# Patient Record
Sex: Female | Born: 1970
Health system: Southern US, Community
[De-identification: ages and names within clinical notes are randomized; demographics above are authoritative.]

## PROBLEM LIST (undated history)

## (undated) DIAGNOSIS — D219 Benign neoplasm of connective and other soft tissue, unspecified: Secondary | ICD-10-CM

## (undated) DIAGNOSIS — T7840XA Allergy, unspecified, initial encounter: Secondary | ICD-10-CM

## (undated) HISTORY — PX: NO PAST SURGERIES: SHX2092

## (undated) HISTORY — PX: TUBAL LIGATION: SHX77

## (undated) HISTORY — DX: Benign neoplasm of connective and other soft tissue, unspecified: D21.9

## (undated) HISTORY — DX: Allergy, unspecified, initial encounter: T78.40XA

---

## 2006-06-06 ENCOUNTER — Inpatient Hospital Stay: Payer: Self-pay | Admitting: Surgery

## 2009-07-12 ENCOUNTER — Other Ambulatory Visit: Payer: Self-pay | Admitting: General Practice

## 2014-03-09 ENCOUNTER — Ambulatory Visit (INDEPENDENT_AMBULATORY_CARE_PROVIDER_SITE_OTHER): Payer: 59 | Admitting: Podiatry

## 2014-03-09 ENCOUNTER — Ambulatory Visit (INDEPENDENT_AMBULATORY_CARE_PROVIDER_SITE_OTHER): Payer: 59

## 2014-03-09 ENCOUNTER — Encounter: Payer: Self-pay | Admitting: Podiatry

## 2014-03-09 VITALS — Resp 16 | Ht 62.0 in | Wt 200.0 lb

## 2014-03-09 DIAGNOSIS — M722 Plantar fascial fibromatosis: Secondary | ICD-10-CM

## 2014-03-09 DIAGNOSIS — M79609 Pain in unspecified limb: Secondary | ICD-10-CM

## 2014-03-09 DIAGNOSIS — M79673 Pain in unspecified foot: Secondary | ICD-10-CM

## 2014-03-09 DIAGNOSIS — M7750 Other enthesopathy of unspecified foot: Secondary | ICD-10-CM

## 2014-03-09 MED ORDER — TRIAMCINOLONE ACETONIDE 10 MG/ML IJ SUSP
10.0000 mg | Freq: Once | INTRAMUSCULAR | Status: AC
  Administered 2014-03-09: 10 mg

## 2014-03-09 MED ORDER — DICLOFENAC SODIUM 75 MG PO TBEC: 75.0000 mg | DELAYED_RELEASE_TABLET | Freq: Two times a day (BID) | ORAL | Status: DC

## 2014-03-09 NOTE — Progress Notes (Signed)
Subjective:     Patient ID: Joanna Miller, female   DOB: Mar 27, 1971, 43 y.o.   MRN: [id number]  Foot Pain   patient presents stating I'm having pain in my right heel which has been present for around a year it has gradually gotten worse pain in my left heel and also on top of my left foot and around the back of my right leg. Patient states the pain has intensified over the last few months   Review of Systems  All other systems reviewed and are negative.       Objective:   Physical Exam  Nursing note and vitals reviewed. Constitutional: She is oriented to person, place, and time.  Cardiovascular: Intact distal pulses.   Musculoskeletal: Normal range of motion.  Neurological: She is oriented to person, place, and time.  Skin: Skin is warm.   neurovascular status intact with range of motion adequate and muscle strength within normal limits. Patient's well-perfused and does have flatfoot deformity noted on gait evaluation and is found to have severe discomfort heel region right over left with inflammation and fluid buildup. Mild discomfort dorsum of left foot which is localized in nature and not intense    Assessment:     Plantar fasciitis he'll right over left with inflammation and fluid along with mechanical dysfunction and probable compensation-like pain    Plan:     H&P performed x-rays reviewed today I injected the plantar heel both feet 3 mg Kenalog 5 of Xylocaine Marcaine mixture dispensed fascial brace for the right foot full tear and 75 mg twice a day and instructions on physical therapy. Discussed long-term orthotic usage

## 2014-03-09 NOTE — Progress Notes (Signed)
   Subjective:    Patient ID: Joanna Miller, female    DOB: 03/14/1971, 43 y.o.   MRN: [id number]  HPI Comments: N pain L left foot -lateral, top and heel D 6 months O slowly C worse A walking, after sitting T ice, heat   N PAIN L RIGHT HEEL D 6 MONTHS O SLOWLY C WORSE A WALKING, AFTER SITTING T ICE, HEAT    Foot Pain Associated symptoms include fatigue.      Review of Systems  Constitutional: Positive for fatigue.       WEIGHT CHANGES  HENT: Negative.   Eyes: Negative.   Respiratory: Negative.   Cardiovascular: Negative.   Gastrointestinal: Negative.   Endocrine: Negative.   Genitourinary: Negative.   Musculoskeletal:       BACK PAIN   Skin: Negative.   Allergic/Immunologic: Negative.   Neurological: Negative.   Hematological: Negative.   Psychiatric/Behavioral: Negative.        Objective:   Physical Exam        Assessment & Plan:

## 2014-03-09 NOTE — Patient Instructions (Signed)

## 2014-03-23 ENCOUNTER — Encounter: Payer: Self-pay | Admitting: Podiatry

## 2014-03-23 ENCOUNTER — Ambulatory Visit (INDEPENDENT_AMBULATORY_CARE_PROVIDER_SITE_OTHER): Payer: 59 | Admitting: Podiatry

## 2014-03-23 VITALS — BP 137/81 | HR 78 | Resp 16

## 2014-03-23 DIAGNOSIS — M722 Plantar fascial fibromatosis: Secondary | ICD-10-CM

## 2014-03-23 MED ORDER — TRIAMCINOLONE ACETONIDE 10 MG/ML IJ SUSP
10.0000 mg | Freq: Once | INTRAMUSCULAR | Status: AC
  Administered 2014-03-23: 10 mg

## 2014-03-23 NOTE — Progress Notes (Signed)
Subjective:     Patient ID: Joanna Miller, female   DOB: 11-Nov-1971, 43 y.o.   MRN: [id number]  HPI patient states the heel is still quite sore right but somewhat improved from previously and the arch is improved   Review of Systems     Objective:   Physical Exam Neurovascular status intact with discomfort in the plantar heel region right over left still present but improved    Assessment:     Improve plantar fasciitis right over left with severe structural malalignment and continued pain especially in the morning and after periods of sitting    Plan:     Reinjected the plantar fascia 3 mg Kenalog 5 lymphocyte Marcaine mixture dispensed night splint with instructions and scanned for custom orthotics to reduce stress against the arch

## 2014-04-06 ENCOUNTER — Encounter: Payer: Self-pay | Admitting: Podiatry

## 2014-04-06 ENCOUNTER — Telehealth: Payer: Self-pay | Admitting: Podiatry

## 2014-04-13 ENCOUNTER — Ambulatory Visit (INDEPENDENT_AMBULATORY_CARE_PROVIDER_SITE_OTHER): Payer: 59 | Admitting: *Deleted

## 2014-04-13 VITALS — Resp 16 | Ht 64.0 in | Wt 200.0 lb

## 2014-04-13 DIAGNOSIS — M722 Plantar fascial fibromatosis: Secondary | ICD-10-CM

## 2014-04-13 NOTE — Progress Notes (Signed)
Pt presents to pick up orthotics. Went over wearing instructions with pt. 

## 2014-04-13 NOTE — Patient Instructions (Signed)

## 2014-05-08 ENCOUNTER — Ambulatory Visit: Payer: 59 | Admitting: Podiatry

## 2014-05-11 ENCOUNTER — Ambulatory Visit: Payer: 59 | Admitting: Podiatry

## 2014-05-18 ENCOUNTER — Ambulatory Visit (INDEPENDENT_AMBULATORY_CARE_PROVIDER_SITE_OTHER): Payer: 59 | Admitting: Podiatry

## 2014-05-18 VITALS — BP 145/81 | HR 90 | Resp 16

## 2014-05-18 DIAGNOSIS — M722 Plantar fascial fibromatosis: Secondary | ICD-10-CM

## 2014-05-18 MED ORDER — TRIAMCINOLONE ACETONIDE 10 MG/ML IJ SUSP
10.0000 mg | Freq: Once | INTRAMUSCULAR | Status: AC
  Administered 2014-05-18: 10 mg

## 2014-05-18 NOTE — Progress Notes (Signed)
Subjective:     Patient ID: Joanna Miller, female   DOB: 1971-06-25, 43 y.o.   MRN: [id number]  HPI patient presents stating that she still having problems with his right foot and it's worse when she's on hard cement floors and spends time walking on it   Review of Systems     Objective:   Physical Exam Neurovascular status intact with very painful plantar heel right at the insertional point of the tendon into the calcaneus    Assessment:     Plantar fasciitis right with inflammation and fluid buildup    Plan:     H&P performed and discussed on complete immobilization with air fracture walker dispensed to patient. Injected the right plantar fascia trimming of Kenalog 5 mg Xylocaine Marcaine mixture and begin diclofenac 75 mg twice a day and again. Point to recheck in one month

## 2014-06-15 ENCOUNTER — Encounter: Payer: Self-pay | Admitting: Podiatry

## 2014-06-15 ENCOUNTER — Ambulatory Visit (INDEPENDENT_AMBULATORY_CARE_PROVIDER_SITE_OTHER): Payer: 59 | Admitting: Podiatry

## 2014-06-15 DIAGNOSIS — M722 Plantar fascial fibromatosis: Secondary | ICD-10-CM

## 2014-06-15 NOTE — Progress Notes (Signed)
Subjective:     Patient ID: Joanna Miller, female   DOB: 1971-10-18, 43 y.o.   MRN: [id number]  HPI patient states that it's doing much better and the pain has gotten significantly improved   Review of Systems     Objective:   Physical Exam Neurovascular status intact with some lifting and diminishment of discomfort right foot heel upon palpation    Assessment:     Improve plantar fasciitis right with immobilization    Plan:     Advised him gradual reduction of him mobilization continued physical therapy and ice therapy and supportive shoe gear usage

## 2014-11-11 ENCOUNTER — Emergency Department: Payer: Self-pay | Admitting: Emergency Medicine

## 2014-11-11 LAB — URINALYSIS, COMPLETE
Bacteria: NONE SEEN
Bilirubin,UR: NEGATIVE
Glucose,UR: NEGATIVE mg/dL (ref 0–75)
Ketone: NEGATIVE
Leukocyte Esterase: NEGATIVE
Nitrite: NEGATIVE
PH: 7 (ref 4.5–8.0)
Protein: 30
RBC,UR: 3 /HPF (ref 0–5)
SPECIFIC GRAVITY: 1.018 (ref 1.003–1.030)
Squamous Epithelial: 4
WBC UR: 1 /HPF (ref 0–5)

## 2015-10-04 ENCOUNTER — Ambulatory Visit: Payer: Self-pay | Admitting: Physician Assistant

## 2015-10-04 ENCOUNTER — Encounter: Payer: Self-pay | Admitting: Physician Assistant

## 2015-10-04 VITALS — BP 120/70 | Temp 98.8°F | Wt 190.0 lb

## 2015-10-04 DIAGNOSIS — J0100 Acute maxillary sinusitis, unspecified: Secondary | ICD-10-CM

## 2015-10-04 MED ORDER — AZITHROMYCIN 250 MG PO TABS: ORAL_TABLET | ORAL | Status: DC

## 2015-10-04 NOTE — Progress Notes (Signed)
S/ chronic allergies, now with acute sxs. Purulent pnd hoarseness, left ear pain  O/ VSS NAD alert  ENT + left max sinus tenderness, tms clear, neck supple Heart rsr lungs clear A/ sinusitis P/ zpack supportive measures. Nasal saline  .

## 2016-05-27 ENCOUNTER — Ambulatory Visit: Payer: Self-pay | Admitting: Physician Assistant

## 2016-05-27 ENCOUNTER — Encounter: Payer: Self-pay | Admitting: Physician Assistant

## 2016-05-27 VITALS — BP 132/70 | HR 76 | Temp 98.5°F

## 2016-05-27 DIAGNOSIS — J302 Other seasonal allergic rhinitis: Secondary | ICD-10-CM

## 2016-05-27 MED ORDER — PREDNISONE 10 MG PO TABS: 30.0000 mg | ORAL_TABLET | Freq: Every day | ORAL | Status: DC

## 2016-05-27 NOTE — Progress Notes (Signed)
S: c/o runny nose, congestion, watery eyes, some sinus pressure, sx for about a week, denies fever/chills/body aches, cough, cp/sob, or v/d  O: vitals wnl, nad, perrl eomi, conjunctiva wnl, tms dull, nasal mucosa swollen and boggy, throat wnl, neck supple no lymph, lungs c t a, cv rrr  A: acute seasonal allergies  P: saline nasal rinse, prednisone 30mg  qd x 3d, switch from allegra to zyrtec or claritin

## 2016-09-21 ENCOUNTER — Encounter: Payer: Self-pay | Admitting: Physician Assistant

## 2016-09-21 ENCOUNTER — Ambulatory Visit: Payer: Self-pay | Admitting: Physician Assistant

## 2016-09-21 VITALS — BP 138/76 | HR 96 | Temp 98.4°F

## 2016-09-21 DIAGNOSIS — J301 Allergic rhinitis due to pollen: Secondary | ICD-10-CM

## 2016-09-21 MED ORDER — FLUTICASONE PROPIONATE 50 MCG/ACT NA SUSP: 2.0000 | Freq: Every day | NASAL | 6 refills | Status: AC

## 2016-09-21 NOTE — Progress Notes (Signed)
S: c/o runny nose, congestion, scratchy throat, some sinus pressure, sx for about a week, denies fever/chills/body aches, cough, cp/sob, or v/d, grandson was dx'd with strep this morning, no close contact but he was at her house  O: vitals wnl, nad, perrl eomi, conjunctiva wnl, tms dull, nasal mucosa swollen and boggy, throat wnl, neck supple no lymph, lungs c t a, cv rrr  A: acute seasonal allergies  P: saline nasal rinse, flonase, if worsening return for recheck

## 2016-09-24 DIAGNOSIS — H5213 Myopia, bilateral: Secondary | ICD-10-CM | POA: Diagnosis not present

## 2016-12-25 ENCOUNTER — Encounter: Payer: Self-pay | Admitting: Physician Assistant

## 2016-12-25 ENCOUNTER — Ambulatory Visit: Payer: Self-pay | Admitting: Physician Assistant

## 2016-12-25 VITALS — BP 120/80 | HR 76 | Temp 98.6°F

## 2016-12-25 DIAGNOSIS — J069 Acute upper respiratory infection, unspecified: Secondary | ICD-10-CM

## 2016-12-25 MED ORDER — AZITHROMYCIN 250 MG PO TABS: ORAL_TABLET | ORAL | 0 refills | Status: DC

## 2016-12-25 MED ORDER — BENZONATATE 200 MG PO CAPS: 200.0000 mg | ORAL_CAPSULE | Freq: Three times a day (TID) | ORAL | 0 refills | Status: DC | PRN

## 2016-12-25 NOTE — Progress Notes (Signed)
S: C/o runny nose and congestion for 3 days, no fever, chills, cp/sob, v/d; mucus was yellow this am but clear throughout the day, cough is sporadic, no bodyaches  Using otc meds: tylenol cold  O: PE: vitals wnl, nad, perrl eomi, normocephalic, tms dull, nasal mucosa red and swollen, throat injected, neck supple no lymph, lungs c t a, cv rrr, neuro intact  A:  Acute viral uri   P: drink fluids, continue regular meds , use otc meds of choice, return if not improving in 5 days, return earlier if worsening , tessalon perls, rx for zpack if worsening with green/yellow mucus

## 2017-06-18 ENCOUNTER — Ambulatory Visit: Payer: Self-pay | Admitting: Family

## 2017-06-18 VITALS — BP 118/70 | HR 80 | Temp 97.5°F

## 2017-06-18 DIAGNOSIS — J301 Allergic rhinitis due to pollen: Secondary | ICD-10-CM

## 2017-06-18 NOTE — Progress Notes (Signed)
S/: Nasal pressure ,ear pressure, and some associated hoarseness for the past 4-5 days, symptoms actually improved today. She was unclear that she could use her Flonase daily and has been using it when necessary. Taking Zyrtec or loratadine. No fever ,cough ,chest pain or shortness of breath O/: Vital signs are stable alert pleasant no acute distress ENT nasal turbinates are 4+ boggy mildly erythematous negative facial tenderness throat unremarkable neck supple heart RSR lungs are clear  A/allergic rhinitis P/  allergy tips reviewed and encouraged copy of tips given. F/u prn not improving.

## 2017-12-23 ENCOUNTER — Ambulatory Visit: Payer: Self-pay | Admitting: Family Medicine

## 2017-12-23 VITALS — BP 121/60 | HR 85 | Temp 98.1°F | Resp 16 | Wt 250.0 lb

## 2017-12-23 DIAGNOSIS — M5431 Sciatica, right side: Secondary | ICD-10-CM

## 2017-12-23 MED ORDER — CYCLOBENZAPRINE HCL 10 MG PO TABS: 10.0000 mg | ORAL_TABLET | Freq: Three times a day (TID) | ORAL | 0 refills | Status: DC | PRN

## 2017-12-23 MED ORDER — PREDNISONE 20 MG PO TABS: ORAL_TABLET | ORAL | 0 refills | Status: DC

## 2017-12-23 NOTE — Progress Notes (Signed)
Subjective:  Joanna Miller is a 47 y.o. female who presents for evaluation of low back pain radiating into right hip and leg . The patient has had no prior back problems. Symptoms have been present for 2 day and are gradually worsening.  Onset was related to / precipitated by no known injury. The pain is located in the right sacroiliac area or right gluteal area and radiates to the right lower leg. The pain is described as sharp and occurs persistently. She rates her pain as severe. Symptoms are exacerbated by walking and changing positions.  Symptoms are improved by with immobility.She has also tried NSAIDs which provided no symptom relief. She has tingling in the right leg associated with the back pain. The patient has no "red flag" history indicative of complicated back pain.  Review of Systems  Respiratory: Negative.   Cardiovascular: Negative.   Musculoskeletal: Positive for back pain.  Skin: Negative.   Neurological: Positive for tingling.       Right leg tingling   Psychiatric/Behavioral: Negative.      Objective:  Physical Exam  Neck: Normal range of motion.  Cardiovascular: Normal rate, regular rhythm and normal heart sounds.  Pulmonary/Chest: Effort normal and breath sounds normal.  Neurological: She displays normal reflexes. She exhibits normal muscle tone.  Antalgic gait secondary to pain with weight bearing  Skin: Skin is warm and dry.  Psychiatric: She has a normal mood and affect. Her behavior is normal. Judgment and thought content normal.    Back exam normal    Assessment:  Sciatica     Plan:  Will trial prednisone taper in conjunction with cyclobenzaprine for acute pain relief.  No imaging available at clinic.  Advised patient if symptoms worsen, do not improve, or recur she will need to follow-up with her PCP for further evaluation and consider diagnostic imaging.  Meds ordered this encounter  Medications  . cyclobenzaprine (FLEXERIL) 10 MG tablet    Sig:  Take 1 tablet (10 mg total) by mouth 3 (three) times daily as needed for muscle spasms.    Dispense:  30 tablet    Refill:  0  . predniSONE (DELTASONE) 20 MG tablet    Sig: Take 60 mg (3 tablets) x 3 day, 40 mg (2 tablets) x 1 day, and 20 mg (1 tablet) x 1 day. Take medication with breakfast only.    Dispense:  12 tablet    Refill:  0   Carroll Sage. Kenton Kingfisher, MSN, FNP-C [place]  [place], [place] [address]

## 2017-12-23 NOTE — Patient Instructions (Signed)
Complete all medication as prescribed.  If symptoms worsen or do not improve follow-up with PCP.  If you experience any weakness, numbness, tingling,  follow-up immediately at the nearest Emergency Department or with PCP.    Sciatica Sciatica is pain, numbness, weakness, or tingling along your sciatic nerve. The sciatic nerve starts in the lower back and goes down the back of each leg. Sciatica happens when this nerve is pinched or has pressure put on it. Sciatica usually goes away on its own or with treatment. Sometimes, sciatica may keep coming back (recur). Follow these instructions at home: Medicines  Take over-the-counter and prescription medicines only as told by your doctor.  Do not drive or use heavy machinery while taking prescription pain medicine. Managing pain  If directed, put ice on the affected area. ? Put ice in a plastic bag. ? Place a towel between your skin and the bag. ? Leave the ice on for 20 minutes, 2-3 times a day.  After icing, apply heat to the affected area before you exercise or as often as told by your doctor. Use the heat source that your doctor tells you to use, such as a moist heat pack or a heating pad. ? Place a towel between your skin and the heat source. ? Leave the heat on for 20-30 minutes. ? Remove the heat if your skin turns bright red. This is especially important if you are unable to feel pain, heat, or cold. You may have a greater risk of getting burned. Activity  Return to your normal activities as told by your doctor. Ask your doctor what activities are safe for you. ? Avoid activities that make your sciatica worse.  Take short rests during the day. Rest in a lying or standing position. This is usually better than sitting to rest. ? When you rest for a long time, do some physical activity or stretching between periods of rest. ? Avoid sitting for a long time without moving. Get up and move around at least one time each hour.  Exercise and  stretch regularly, as told by your doctor.  Do not lift anything that is heavier than 10 lb (4.5 kg) while you have symptoms of sciatica. ? Avoid lifting heavy things even when you do not have symptoms. ? Avoid lifting heavy things over and over.  When you lift objects, always lift in a way that is safe for your body. To do this, you should: ? Bend your knees. ? Keep the object close to your body. ? Avoid twisting. General instructions  Use good posture. ? Avoid leaning forward when you are sitting. ? Avoid hunching over when you are standing.  Stay at a healthy weight.  Wear comfortable shoes that support your feet. Avoid wearing high heels.  Avoid sleeping on a mattress that is too soft or too hard. You might have less pain if you sleep on a mattress that is firm enough to support your back.  Keep all follow-up visits as told by your doctor. This is important. Contact a doctor if:  You have pain that: ? Wakes you up when you are sleeping. ? Gets worse when you lie down. ? Is worse than the pain you have had in the past. ? Lasts longer than 4 weeks.  You lose weight for without trying. Get help right away if:  You cannot control when you pee (urinate) or poop (have a bowel movement).  You have weakness in any of these areas and it gets  worse. ? Lower back. ? Lower belly (pelvis). ? Butt (buttocks). ? Legs.  You have redness or swelling of your back.  You have a burning feeling when you pee. This information is not intended to replace advice given to you by your health care provider. Make sure you discuss any questions you have with your health care provider. Document Released: 09/08/2008 Document Revised: 05/07/2016 Document Reviewed: 08/09/2015 Elsevier Interactive Patient Education  [employer].

## 2017-12-25 DIAGNOSIS — M5431 Sciatica, right side: Secondary | ICD-10-CM | POA: Diagnosis not present

## 2017-12-25 DIAGNOSIS — M5136 Other intervertebral disc degeneration, lumbar region: Secondary | ICD-10-CM | POA: Diagnosis not present

## 2018-06-20 DIAGNOSIS — M546 Pain in thoracic spine: Secondary | ICD-10-CM | POA: Diagnosis not present

## 2018-06-20 DIAGNOSIS — M62838 Other muscle spasm: Secondary | ICD-10-CM | POA: Diagnosis not present

## 2019-01-03 ENCOUNTER — Ambulatory Visit: Payer: Self-pay | Admitting: Physician Assistant

## 2019-01-03 ENCOUNTER — Encounter: Payer: Self-pay | Admitting: Physician Assistant

## 2019-01-03 VITALS — BP 132/70 | HR 78 | Temp 98.2°F | Resp 12 | Ht 63.0 in | Wt 223.0 lb

## 2019-01-03 DIAGNOSIS — J069 Acute upper respiratory infection, unspecified: Secondary | ICD-10-CM

## 2019-01-03 DIAGNOSIS — R05 Cough: Secondary | ICD-10-CM

## 2019-01-03 DIAGNOSIS — R0981 Nasal congestion: Secondary | ICD-10-CM

## 2019-01-03 DIAGNOSIS — R059 Cough, unspecified: Secondary | ICD-10-CM

## 2019-01-03 DIAGNOSIS — R011 Cardiac murmur, unspecified: Secondary | ICD-10-CM

## 2019-01-03 MED ORDER — CETIRIZINE HCL 10 MG PO TABS: 10.0000 mg | ORAL_TABLET | Freq: Every day | ORAL | 0 refills | Status: DC

## 2019-01-03 MED ORDER — BENZONATATE 200 MG PO CAPS: 200.0000 mg | ORAL_CAPSULE | Freq: Three times a day (TID) | ORAL | 0 refills | Status: DC | PRN

## 2019-01-03 MED ORDER — PSEUDOEPHEDRINE HCL 30 MG PO TABS: 30.0000 mg | ORAL_TABLET | ORAL | 0 refills | Status: DC | PRN

## 2019-01-03 MED ORDER — AZELASTINE HCL 0.1 % NA SOLN: 1.0000 | Freq: Two times a day (BID) | NASAL | 0 refills | Status: AC

## 2019-01-03 NOTE — Progress Notes (Signed)
Patient ID: Joanna Miller DOB: 05-03-1971 AGE: 48 y.o. MRN: [id number]   PCP: Joanna Miller   Chief Complaint:  Chief Complaint  Patient presents with  . Nasal Congestion    x 2d  . Headache    x 2d  . Cough    x 2d     Subjective:    HPI:  Joanna Miller is a 48 y.o. female presents for evaluation  Chief Complaint  Patient presents with  . Nasal Congestion    x 2d  . Headache    x 2d  . Cough    x 38d    48 year old female presents to [facility] with 3 day history of URI symptoms. Began with scratchy throat. Then developed nasal congestion and postnasal drip. Reports headache across forehead and occasional cough (from PND). Has been using Flonase the past few days with no improvement. Was on Zyrtec daily, ran out, has not been taking for months. Patient with seasonal allergies/allergic rhinitis. Denies fever, chills, dizziness/lightheadedness, ear pain, maxillary sinus pain/pressure/congestion, chest pain, chest congestion, shortness of breath, wheezing. Patient states she does not want illness to become sinusitis or bronchitis. Patient with no smoking history. Denies asthma history. Reports frequent bronchitis as a young kid.  A limited review of symptoms was performed, pertinent positives and negatives as mentioned in HPI.  The following portions of the patient's history were reviewed and updated as appropriate: allergies, current medications and past medical history.  There are no active problems to display for this patient.   No Known Allergies  Current Outpatient Medications on File Prior to Visit  Medication Sig Dispense Refill  . cyclobenzaprine (FLEXERIL) 10 MG tablet Take 1 tablet (10 mg total) by mouth 3 (three) times daily as needed for muscle spasms. 30 tablet 0  . fluticasone (FLONASE) 50 MCG/ACT nasal spray Place 2 sprays into both nostrils daily. 16 g 6  . tiZANidine (ZANAFLEX) 2 MG tablet Take by mouth.    . cetirizine (ZYRTEC) 5 MG  tablet Take 5 mg by mouth daily.     No current facility-administered medications on file prior to visit.        Objective:   Vitals:   01/03/19 1224  BP: 132/70  Pulse: 78  Resp: 12  Temp: 98.2 F (36.8 C)  SpO2: 98%     Wt Readings from Last 3 Encounters:  01/03/19 223 lb (101.2 kg)  12/23/17 250 lb (113.4 kg)  10/04/15 190 lb (86.2 kg)    Physical Exam:   General Appearance:  Patient sitting comfortably on examination table. Conversational. [name] self-historian. In no acute distress. Afebrile.   Head:  Normocephalic, without obvious abnormality, atraumatic  Eyes:  PERRL, conjunctiva/corneas clear, EOM's intact  Ears:  Bilateral ear canals WNL. No erythema or edema. No discharge/drainage. Bilateral TMs WNL. No erythema, injection, or serous effusion. No scar tissue.  Nose: Nares normal, septum midline. Nasal mucosa reveals bilateral edema, almost complete closure in left nare. Scant clear rhinorrhea. No visible polyp. No sinus tenderness with percussion/palpation.  Throat: Lips, mucosa, and tongue normal; teeth and gums normal. Throat reveals no erythema. Tonsils with no enlargement or exudate.  Neck: Supple, symmetrical, trachea midline, no adenopathy  Lungs:   Clear to auscultation bilaterally, respirations unlabored. Occasional dry/shallow, clearing throat cough. No wheezing. No rales, rhonchi, or crackles.  Heart:  Regular rate and rhythm. 3/6 systolic cardiac murmur, best heard at right upper sternal border.  Extremities: Extremities normal, atraumatic, no cyanosis  or edema  Pulses: 2+ and symmetric  Skin: Skin color, texture, turgor normal, no rashes or lesions  Lymph nodes: Cervical, supraclavicular, and axillary nodes normal  Neurologic: Normal    Assessment & Plan:    Exam findings, diagnosis etiology and medication use and indications reviewed with patient. Follow-Up and discharge instructions provided. No emergent/urgent issues found on exam.  Patient  education was provided.   Patient verbalized understanding of information provided and agrees with plan of care (POC), all questions answered. The patient is advised to call or return to clinic if condition does not see an improvement in symptoms, or to seek the care of the closest emergency department if condition worsens with the below plan.    1. Upper respiratory tract infection, unspecified type  - azelastine (ASTELIN) 0.1 % nasal spray; Place 1 spray into both nostrils 2 (two) times daily. Use in each nostril as directed  Dispense: 30 mL; Refill: 0 - cetirizine (ZYRTEC) 10 MG tablet; Take 1 tablet (10 mg total) by mouth daily.  Dispense: 30 tablet; Refill: 0 - benzonatate (TESSALON) 200 MG capsule; Take 1 capsule (200 mg total) by mouth 3 (three) times daily as needed for cough.  Dispense: 20 capsule; Refill: 0 - pseudoephedrine (SUDAFED) 30 MG tablet; Take 1 tablet (30 mg total) by mouth every 4 (four) hours as needed for congestion.  Dispense: 30 tablet; Refill: 0  2. Nasal congestion  3. Cough  4. Cardiac murmur  Patient with 3 day history of URI symptoms. Scratchy throat, nasal congestion, PND, cough. Afebrile. VSS. In no acute distress. Short duration. Underlying seasonal allergies. No asthma or smoking history. Lungs clear to ausculation. Will treat patient for a self-limited viral URI at this time. Prescribed Zyrtec, Astelin nasal spray, Sudafed (no HTN history), and Benzonatate for cough. Advised patient follow-up with PCP, urgent care, or [facility] for further evaluation in one week if not better, sooner with worsening symptoms.  Patient with previously undiagnosed cardiac murmur, suspect aortic stenosis. Denies chest pain, SOB, SOB with exertion, lightheadedness/dizziness, extremity weakness/paresthesias, palpitations, sycope. No history of CHF. No peripheral edema. Bilateral radial pulses equal. No audible carotid bruit. Advised patient establish care with a PCP. Next step would  be echocardiogram, most likely. Possible cardiac referral. And close management. Patient understands, agrees.   Darlin Priestly, MHS, PA-C Montey Hora, MHS, PA-C Advanced Practice Provider [facility]  [address], [facility], [place], [place] [address] (p):  [phone] [email or web address]

## 2019-01-03 NOTE — Patient Instructions (Addendum)
Thank you for choosing [facility] for your health care needs.  You have been diagnosed with an upper respiratory infection (a cold).  Recommend increase fluids; water, Gatorade, diluted juice (1/2 water and 1/2 orange juice), or hot tea with lemon/honey. Rest. Use cool mist humidifier in the bedroom. Prop self up with several pillows to help with cough.  You have been prescribed a nasal spray (continue Flonase as well), a decongestant (Sudafed), a refill of your antihistamine (Zyrtec), and a cough suppressant (Tessalon Perles).  Follow up with family physician, urgent care, or [facility] in one week if symptoms not improving. Sooner with any worsening symptoms.  Hope you feel better soon!  Upper Respiratory Infection, Adult An upper respiratory infection (URI) affects the nose, throat, and upper air passages. URIs are caused by germs (viruses). The most common type of URI is often called "the common cold." Medicines cannot cure URIs, but you can do things at home to relieve your symptoms. URIs usually get better within 7-10 days. Follow these instructions at home: Activity  Rest as needed.  If you have a fever, stay home from work or school until your fever is gone, or until your doctor says you may return to work or school. ? You should stay home until you cannot spread the infection anymore (you are not contagious). ? Your doctor may have you wear a face mask so you have less risk of spreading the infection. Relieving symptoms  Gargle with a salt-water mixture 3-4 times a day or as needed. To make a salt-water mixture, completely dissolve -1 tsp of salt in 1 cup of warm water.  Use a cool-mist humidifier to add moisture to the air. This can help you breathe more easily. Eating and drinking   Drink enough fluid to keep your pee (urine) pale yellow.  Eat soups and other clear broths. General instructions   Take over-the-counter and prescription medicines only as told by your  doctor. These include cold medicines, fever reducers, and cough suppressants.  Do not use any products that contain nicotine or tobacco. These include cigarettes and e-cigarettes. If you need help quitting, ask your doctor.  Avoid being where people are smoking (avoid secondhand smoke).  Make sure you get regular shots and get the flu shot every year.  Keep all follow-up visits as told by your doctor. This is important. How to avoid spreading infection to others   Wash your hands often with soap and water. If you do not have soap and water, use hand sanitizer.  Avoid touching your mouth, face, eyes, or nose.  Cough or sneeze into a tissue or your sleeve or elbow. Do not cough or sneeze into your hand or into the air. Contact a doctor if:  You are getting worse, not better.  You have any of these: ? A fever. ? Chills. ? Brown or red mucus in your nose. ? Yellow or brown fluid (discharge)coming from your nose. ? Pain in your face, especially when you bend forward. ? Swollen neck glands. ? Pain with swallowing. ? White areas in the back of your throat. Get help right away if:  You have shortness of breath that gets worse.  You have very bad or constant: ? Headache. ? Ear pain. ? Pain in your forehead, behind your eyes, and over your cheekbones (sinus pain). ? Chest pain.  You have long-lasting (chronic) lung disease along with any of these: ? Wheezing. ? Long-lasting cough. ? Coughing up blood. ? A change in your  usual mucus.  You have a stiff neck.  You have changes in your: ? Vision. ? Hearing. ? Thinking. ? Mood. Summary  An upper respiratory infection (URI) is caused by a germ called a virus. The most common type of URI is often called "the common cold."  URIs usually get better within 7-10 days.  Take over-the-counter and prescription medicines only as told by your doctor. This information is not intended to replace advice given to you by your health care  provider. Make sure you discuss any questions you have with your health care provider. Document Released: 05/18/2008 Document Revised: 07/23/2017 Document Reviewed: 07/23/2017 Elsevier Interactive Patient Education  2019 [employer].

## 2019-01-05 ENCOUNTER — Telehealth: Payer: Self-pay | Admitting: Emergency Medicine

## 2019-01-05 NOTE — Telephone Encounter (Signed)
Left message follow up call from [facility] visit

## 2020-04-26 ENCOUNTER — Encounter: Payer: Self-pay | Admitting: Emergency Medicine

## 2020-04-26 ENCOUNTER — Other Ambulatory Visit: Payer: Self-pay

## 2020-04-26 ENCOUNTER — Ambulatory Visit
Admission: EM | Admit: 2020-04-26 | Discharge: 2020-04-26 | Disposition: A | Discharge status: Home / Self Care | Payer: 59 | Attending: Emergency Medicine | Admitting: Emergency Medicine

## 2020-04-26 DIAGNOSIS — H60501 Unspecified acute noninfective otitis externa, right ear: Secondary | ICD-10-CM | POA: Diagnosis not present

## 2020-04-26 DIAGNOSIS — Z0189 Encounter for other specified special examinations: Secondary | ICD-10-CM

## 2020-04-26 MED ORDER — NEOMYCIN-POLYMYXIN-HC 3.5-10000-1 OT SUSP: 4.0000 [drp] | Freq: Three times a day (TID) | OTIC | 0 refills | Status: DC

## 2020-04-26 NOTE — ED Provider Notes (Signed)
Roderic Palau    CSN: [id number] Arrival date & time: 04/26/20  1424      History   Chief Complaint Chief Complaint  Patient presents with  . Sinus Problem  . Otalgia    right worse than left    HPI Joanna Miller is a 49 y.o. female.   Patient presents with pain and drainage in her right ear for 2 days.  She reports the pain is causing tenderness in the right side of her face.  She also reports sinus congestion.  She denies fever, chills, toothache, sore throat, cough, shortness of breath, vomiting, diarrhea, rash, or other symptoms.  Patient has been treating her symptoms at home with Astelin and Zyrtec.  Patient requests COVID test; she is fully vaccinated.    The history is provided by the patient.    History reviewed. No pertinent past medical history.  There are no problems to display for this patient.   Past Surgical History:  Procedure Laterality Date  . NO PAST SURGERIES      OB History   No obstetric history on file.      Home Medications    Prior to Admission medications   Medication Sig Start Date End Date Taking? Authorizing Provider  azelastine (ASTELIN) 0.1 % nasal spray Place 1 spray into both nostrils 2 (two) times daily. Use in each nostril as directed 01/03/19  Yes Darlin Priestly, PA-C  cetirizine (ZYRTEC) 10 MG tablet Take 1 tablet (10 mg total) by mouth daily. 01/03/19  Yes Darlin Priestly, PA-C  cetirizine (ZYRTEC) 5 MG tablet Take 5 mg by mouth daily.   Yes [provider]  cyclobenzaprine (FLEXERIL) 10 MG tablet Take 1 tablet (10 mg total) by mouth 3 (three) times daily as needed for muscle spasms. 12/23/17  Yes Scot Jun, FNP  benzonatate (TESSALON) 200 MG capsule Take 1 capsule (200 mg total) by mouth 3 (three) times daily as needed for cough. 01/03/19   Darlin Priestly, PA-C  fluticasone (FLONASE) 50 MCG/ACT nasal spray Place 2 sprays into both nostrils daily. 09/21/16   Fisher, Linden Dolin, PA-C    neomycin-polymyxin-hydrocortisone (CORTISPORIN) 3.5-10000-1 OTIC suspension Place 4 drops into the right ear 3 (three) times daily. 04/26/20   Sharion Balloon, NP  pseudoephedrine (SUDAFED) 30 MG tablet Take 1 tablet (30 mg total) by mouth every 4 (four) hours as needed for congestion. 01/03/19   Darlin Priestly, PA-C    Family History Family History  Problem Relation Age of Onset  . Healthy Mother     Social History Social History   Tobacco Use  . Smoking status: Never Smoker  . Smokeless tobacco: Never Used  Substance Use Topics  . Alcohol use: No    Alcohol/week: 0.0 standard drinks  . Drug use: Not on file     Allergies   Patient has no known allergies.   Review of Systems Review of Systems  Constitutional: Negative for chills and fever.  HENT: Positive for congestion, ear discharge, ear pain and facial swelling. Negative for sore throat.   Eyes: Negative for pain and visual disturbance.  Respiratory: Negative for cough and shortness of breath.   Cardiovascular: Negative for chest pain and palpitations.  Gastrointestinal: Negative for abdominal pain, diarrhea, nausea and vomiting.  Genitourinary: Negative for dysuria and hematuria.  Musculoskeletal: Negative for arthralgias and back pain.  Skin: Negative for color change and rash.  Neurological: Negative for seizures and syncope.  All other systems reviewed and are negative.  Physical Exam Triage Vital Signs ED Triage Vitals  Enc Vitals Group     BP --      Pulse --      Resp --      Temp --      Temp src --      SpO2 --      Weight 04/26/20 1427 223 lb 1.7 oz (101.2 kg)     Height 04/26/20 1427 5\' 3"  (1.6 m)     Head Circumference --      Peak Flow --      Pain Score 04/26/20 1426 5     Pain Loc --      Pain Edu? --      Excl. in Gross? --    No data found.  Updated Vital Signs BP 134/84 (BP Location: Left Arm)   Pulse 93   Temp 98.5 F (36.9 C) (Oral)   Resp 18   Ht 5\' 3"  (1.6 m)   Wt 223 lb  1.7 oz (101.2 kg)   LMP 04/12/2020 (Approximate)   SpO2 97%   BMI 39.52 kg/m   Visual Acuity Right Eye Distance:   Left Eye Distance:   Bilateral Distance:    Right Eye Near:   Left Eye Near:    Bilateral Near:     Physical Exam Vitals and nursing note reviewed.  Constitutional:      General: She is not in acute distress.    Appearance: She is well-developed.  HENT:     Head: Normocephalic and atraumatic.     Right Ear: Tympanic membrane normal.     Left Ear: Tympanic membrane and ear canal normal.     Ears:     Comments: Right ear canal erythematous with small open lesion and scant drainage.      Nose: Nose normal.     Mouth/Throat:     Mouth: Mucous membranes are moist.     Pharynx: Oropharynx is clear.  Eyes:     Conjunctiva/sclera: Conjunctivae normal.  Cardiovascular:     Rate and Rhythm: Normal rate and regular rhythm.     Heart sounds: No murmur.  Pulmonary:     Effort: Pulmonary effort is normal. No respiratory distress.     Breath sounds: Normal breath sounds.  Abdominal:     Palpations: Abdomen is soft.     Tenderness: There is no abdominal tenderness. There is no guarding or rebound.  Musculoskeletal:     Cervical back: Neck supple.  Skin:    General: Skin is warm and dry.     Findings: No rash.  Neurological:     General: No focal deficit present.     Mental Status: She is alert and oriented to person, place, and time.  Psychiatric:        Mood and Affect: Mood normal.        Behavior: Behavior normal.      UC Treatments / Results  Labs (all labs ordered are listed, but only abnormal results are displayed) Labs Reviewed  NOVEL CORONAVIRUS, NAA    EKG   Radiology No results found.  Procedures Procedures (including critical care time)  Medications Ordered in UC Medications - No data to display  Initial Impression / Assessment and Plan / UC Course  I have reviewed the triage vital signs and the nursing notes.  Pertinent labs &  imaging results that were available during my care of the patient were reviewed by me and considered in my medical decision  making (see chart for details).   Acute otitis externa.  Treating with Cortisporin ear drops.  Instructed patient to continue using her allergy medications.  Instructed her to follow-up with her PCP if her symptoms are not improving.  Patient agrees to plan of care.     Final Clinical Impressions(s) / UC Diagnoses   Final diagnoses:  Patient request for diagnostic testing  Acute otitis externa of right ear, unspecified type     Discharge Instructions     Use the ear drops as directed.    Continue to use the allergy medications as directed.    Follow up with your primary care provider if your symptoms are not improving.        ED Prescriptions    Medication Sig Dispense Auth. Provider   neomycin-polymyxin-hydrocortisone (CORTISPORIN) 3.5-10000-1 OTIC suspension Place 4 drops into the right ear 3 (three) times daily. 10 mL Sharion Balloon, NP     PDMP not reviewed this encounter.   Sharion Balloon, NP 04/26/20 1511

## 2020-04-26 NOTE — ED Triage Notes (Signed)
Pt c/o sinus congestion, facial swelling on the right side, sinus pain, pressure and right ear pain. Stated about 2 days ago. Denies fever. Pt states she had her vaccines back in January.

## 2020-04-26 NOTE — Discharge Instructions (Signed)
Use the ear drops as directed.    Continue to use the allergy medications as directed.    Follow up with your primary care provider if your symptoms are not improving.

## 2020-04-27 LAB — SARS-COV-2, NAA 2 DAY TAT

## 2020-04-27 LAB — NOVEL CORONAVIRUS, NAA: SARS-CoV-2, NAA: NOT DETECTED

## 2021-04-22 DIAGNOSIS — M7541 Impingement syndrome of right shoulder: Secondary | ICD-10-CM | POA: Diagnosis not present

## 2021-04-22 DIAGNOSIS — M7542 Impingement syndrome of left shoulder: Secondary | ICD-10-CM | POA: Diagnosis not present

## 2021-09-10 DIAGNOSIS — R42 Dizziness and giddiness: Secondary | ICD-10-CM | POA: Insufficient documentation

## 2021-09-10 DIAGNOSIS — H8110 Benign paroxysmal vertigo, unspecified ear: Secondary | ICD-10-CM | POA: Diagnosis not present

## 2021-09-19 ENCOUNTER — Encounter: Payer: Self-pay | Admitting: Nurse Practitioner

## 2021-09-19 ENCOUNTER — Other Ambulatory Visit: Payer: Self-pay

## 2021-09-19 ENCOUNTER — Ambulatory Visit (INDEPENDENT_AMBULATORY_CARE_PROVIDER_SITE_OTHER): Payer: 59 | Admitting: Nurse Practitioner

## 2021-09-19 VITALS — BP 136/72 | HR 96 | Temp 98.0°F | Resp 18 | Ht 63.0 in | Wt 253.0 lb

## 2021-09-19 DIAGNOSIS — Z131 Encounter for screening for diabetes mellitus: Secondary | ICD-10-CM

## 2021-09-19 DIAGNOSIS — Z1211 Encounter for screening for malignant neoplasm of colon: Secondary | ICD-10-CM

## 2021-09-19 DIAGNOSIS — G8929 Other chronic pain: Secondary | ICD-10-CM

## 2021-09-19 DIAGNOSIS — R42 Dizziness and giddiness: Secondary | ICD-10-CM

## 2021-09-19 DIAGNOSIS — Z23 Encounter for immunization: Secondary | ICD-10-CM | POA: Diagnosis not present

## 2021-09-19 DIAGNOSIS — Z13220 Encounter for screening for lipoid disorders: Secondary | ICD-10-CM

## 2021-09-19 DIAGNOSIS — Z1231 Encounter for screening mammogram for malignant neoplasm of breast: Secondary | ICD-10-CM | POA: Diagnosis not present

## 2021-09-19 DIAGNOSIS — M25512 Pain in left shoulder: Secondary | ICD-10-CM

## 2021-09-19 DIAGNOSIS — Z130 Encounter for screening for diseases of the blood and blood-forming organs and certain disorders involving the immune mechanism: Secondary | ICD-10-CM | POA: Diagnosis not present

## 2021-09-19 DIAGNOSIS — M25511 Pain in right shoulder: Secondary | ICD-10-CM | POA: Diagnosis not present

## 2021-09-19 DIAGNOSIS — R030 Elevated blood-pressure reading, without diagnosis of hypertension: Secondary | ICD-10-CM | POA: Diagnosis not present

## 2021-09-19 DIAGNOSIS — Z0000 Encounter for general adult medical examination without abnormal findings: Secondary | ICD-10-CM

## 2021-09-19 NOTE — Progress Notes (Signed)
BP 136/72   Pulse 96   Temp 98 F (36.7 C) (Oral)   Resp 18   Ht 5\' 3"  (1.6 m)   Wt 253 lb (114.8 kg)   LMP 09/12/2021   SpO2 99%   BMI 44.82 kg/m    Subjective:    Patient ID: Joanna Miller, female    DOB: May 23, 1971, 49 y.o.   MRN: [id number]  HPI: Joanna Miller is a 50 y.o. female, here alone to establish care.   Chief Complaint  Patient presents with   Establish Care   Vertigo: She said she started last Sunday having really bad dizzy spells.  She was seen at [facility] on 09/10/2021.  She was diagnosed with vertigo.  She has been taking meclizine and it has been helping. 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    Bilateral shoulder pain:  She saw Dr. Marvetta Gibbons orthopedics, on 04/22/2021 and was given cortisone shot in left shoulder.  She is doing better right now.    Elevated blood pressure:  She says she has had an elevated blood pressure a couple of times at home.  Around 145/90.  She denies any chest pain or shortness of breath.  Current blood pressure is 136/72.  She will keep blood pressure record and bring to next appointment.  Continue to decrease salt intake.   Relevant past medical, surgical, family and social history reviewed and updated as indicated. Interim medical history since our last visit reviewed. Allergies and medications reviewed and updated.  Review of Systems  Constitutional: Negative for fever or weight change.  Respiratory: Negative for cough and shortness of breath.   Cardiovascular: Negative for chest pain or palpitations.  Gastrointestinal: Negative for abdominal pain, no bowel changes.  Musculoskeletal: Negative for gait problem or joint swelling.  Skin: Negative for rash. Bilateral shoulder pain.  Neurological: positive for dizziness, negative for headache.  No other specific complaints in a complete review of systems (except as listed in HPI above).      Objective:    BP 136/72   Pulse 96   Temp 98 F (36.7 C) (Oral)    Resp 18   Ht 5\' 3"  (1.6 m)   Wt 253 lb (114.8 kg)   LMP 09/12/2021   SpO2 99%   BMI 44.82 kg/m   Wt Readings from Last 3 Encounters:  09/19/21 253 lb (114.8 kg)  04/26/20 223 lb 1.7 oz (101.2 kg)  01/03/19 223 lb (101.2 kg)    Physical Exam  Constitutional: Patient appears well-developed and well-nourished. Obese No distress.  HEENT: head atraumatic, normocephalic, pupils equal and reactive to light, neck supple Cardiovascular: Normal rate, regular rhythm and normal heart sounds.  No murmur heard. No BLE edema. Pulmonary/Chest: Effort normal and breath sounds normal. No respiratory distress. Abdominal: Soft.  There is no tenderness. Psychiatric: Patient has a normal mood and affect. behavior is normal. Judgment and thought content normal.   Results for orders placed or performed during the hospital encounter of 04/26/20  Novel Coronavirus, NAA ([facility])   Specimen: Nasopharyngeal Swab; Nasopharyngeal(NP) swabs in vial transport medium   NASOPHARYNGE  PATIENT  Result Value Ref Range   SARS-CoV-2, NAA Not Detected Not Detected  SARS-COV-2, NAA 2 DAY TAT   NASOPHARYNGE  PATIENT  Result Value Ref Range   SARS-CoV-2, NAA 2 DAY TAT Performed       Assessment & Plan:   1. Encounter for medical examination to establish care   2. Vertigo -  continue doing epley maneuvers,   3. Chronic pain of both shoulders   4. Elevated blood pressure reading -monitor blood pressure -continue to decrease salt in diet  5. Screening for colon cancer  - Cologuard  6. Need for Tdap vaccination  - Tdap vaccine greater than or equal to 7yo IM  7. Encounter for screening mammogram for malignant neoplasm of breast  - MM 3D SCREEN BREAST BILATERAL; Future  8. Screening for deficiency anemia  - CBC with Differential/Platelet  9. Screening for lipid disorders  - Lipid panel  10. Screening for diabetes mellitus  - COMPLETE METABOLIC PANEL WITH GFR - Hemoglobin A1c   Follow up  plan: Return in about 1 week (around 09/26/2021) for cpe.

## 2021-09-20 LAB — COMPLETE METABOLIC PANEL WITH GFR
AG Ratio: 1.5 (calc) (ref 1.0–2.5)
ALT: 54 U/L — ABNORMAL HIGH (ref 6–29)
AST: 34 U/L (ref 10–35)
Albumin: 4.1 g/dL (ref 3.6–5.1)
Alkaline phosphatase (APISO): 146 U/L (ref 37–153)
BUN: 10 mg/dL (ref 7–25)
CO2: 31 mmol/L (ref 20–32)
Calcium: 10.6 mg/dL — ABNORMAL HIGH (ref 8.6–10.4)
Chloride: 100 mmol/L (ref 98–110)
Creat: 0.53 mg/dL (ref 0.50–1.03)
Globulin: 2.7 g/dL (calc) (ref 1.9–3.7)
Glucose, Bld: 134 mg/dL — ABNORMAL HIGH (ref 65–99)
Potassium: 4.2 mmol/L (ref 3.5–5.3)
Sodium: 135 mmol/L (ref 135–146)
Total Bilirubin: 0.4 mg/dL (ref 0.2–1.2)
Total Protein: 6.8 g/dL (ref 6.1–8.1)
eGFR: 112 mL/min/{1.73_m2} (ref >=60)

## 2021-09-20 LAB — LIPID PANEL
Cholesterol: 164 mg/dL (ref <200)
HDL: 67 mg/dL (ref >=50)
LDL Cholesterol (Calc): 79 mg/dL (calc)
Non-HDL Cholesterol (Calc): 97 mg/dL (calc) (ref <130)
Total CHOL/HDL Ratio: 2.4 (calc) (ref <5.0)
Triglycerides: 95 mg/dL (ref <150)

## 2021-09-20 LAB — CBC WITH DIFFERENTIAL/PLATELET
Absolute Monocytes: 717 cells/uL (ref 200–950)
Basophils Absolute: 30 cells/uL (ref 0–200)
Basophils Relative: 0.3 %
Eosinophils Absolute: 91 cells/uL (ref 15–500)
Eosinophils Relative: 0.9 %
HCT: 31.9 % — ABNORMAL LOW (ref 35.0–45.0)
Hemoglobin: 8.9 g/dL — ABNORMAL LOW (ref 11.7–15.5)
Lymphs Abs: 1323 cells/uL (ref 850–3900)
MCH: 20.8 pg — ABNORMAL LOW (ref 27.0–33.0)
MCHC: 27.9 g/dL — ABNORMAL LOW (ref 32.0–36.0)
MCV: 74.5 fL — ABNORMAL LOW (ref 80.0–100.0)
Monocytes Relative: 7.1 %
Neutro Abs: 7939 cells/uL — ABNORMAL HIGH (ref 1500–7800)
Neutrophils Relative %: 78.6 %
Platelets: 214 10*3/uL (ref 140–400)
RBC: 4.28 10*6/uL (ref 3.80–5.10)
RDW: 18.2 % — ABNORMAL HIGH (ref 11.0–15.0)
Total Lymphocyte: 13.1 %
WBC: 10.1 10*3/uL (ref 3.8–10.8)

## 2021-09-20 LAB — HEMOGLOBIN A1C
Hgb A1c MFr Bld: 5.4 % of total Hgb (ref <5.7)
Mean Plasma Glucose: 108 mg/dL
eAG (mmol/L): 6 mmol/L

## 2021-09-22 ENCOUNTER — Other Ambulatory Visit: Payer: Self-pay | Admitting: Nurse Practitioner

## 2021-09-25 ENCOUNTER — Encounter: Payer: Self-pay | Admitting: Nurse Practitioner

## 2021-09-25 ENCOUNTER — Ambulatory Visit (INDEPENDENT_AMBULATORY_CARE_PROVIDER_SITE_OTHER): Payer: 59 | Admitting: Nurse Practitioner

## 2021-09-25 ENCOUNTER — Other Ambulatory Visit: Payer: Self-pay

## 2021-09-25 VITALS — BP 124/82 | HR 100 | Temp 98.4°F | Resp 16 | Wt 253.1 lb

## 2021-09-25 DIAGNOSIS — Z1159 Encounter for screening for other viral diseases: Secondary | ICD-10-CM | POA: Diagnosis not present

## 2021-09-25 DIAGNOSIS — Z114 Encounter for screening for human immunodeficiency virus [HIV]: Secondary | ICD-10-CM | POA: Diagnosis not present

## 2021-09-25 DIAGNOSIS — Z0000 Encounter for general adult medical examination without abnormal findings: Secondary | ICD-10-CM | POA: Diagnosis not present

## 2021-09-25 DIAGNOSIS — N920 Excessive and frequent menstruation with regular cycle: Secondary | ICD-10-CM | POA: Diagnosis not present

## 2021-09-25 DIAGNOSIS — D649 Anemia, unspecified: Secondary | ICD-10-CM | POA: Diagnosis not present

## 2021-09-25 NOTE — Progress Notes (Signed)
Name: Joanna Miller   MRN: [id number]    DOB: Sep 24, 1971   Date:09/25/2021       Progress Note  Subjective  Chief Complaint  Physical  HPI  Patient presents for annual CPE, here alone. Will address anemia, patient aware of possible extra charge.   Anemia/ menorrhagia: Discussed with patient lab results from last visit.  She says she has been anemic before and was told it was due to iron deficiency, she said that was many years ago.  She denies any rectal bleeding. She does say she has had a heavy period for many years.  She was told many years ago it was due to fibroids.  She has tried birth control, however she said it did little for her bleeding.  Her OBGYN left the practice and she never went back.  Referring her back to GYN for menorrhagia.    Diet: Fresh vegetables, lean meats, dairy. Cooks at home, eats out a couple times a week.   Exercise: will start walking.  Discussed recommendations of 150 min a week.    Depression: Phq 9 is  negative Depression screen [facility] 2/9 09/25/2021 09/19/2021  Decreased Interest 0 0  Down, Depressed, Hopeless 0 0  PHQ - 2 Score 0 0  Altered sleeping 0 0  Tired, decreased energy 0 0  Change in appetite 0 0  Feeling bad or failure about yourself  0 0  Trouble concentrating 0 0  Moving slowly or fidgety/restless 0 0  Suicidal thoughts 0 0  PHQ-9 Score 0 0  Difficult doing work/chores Not difficult at all Not difficult at all   Hypertension: BP Readings from Last 3 Encounters:  09/25/21 124/82  09/19/21 136/72  04/26/20 134/84   Obesity: Wt Readings from Last 3 Encounters:  09/25/21 253 lb 1.6 oz (114.8 kg)  09/19/21 253 lb (114.8 kg)  04/26/20 223 lb 1.7 oz (101.2 kg)   BMI Readings from Last 3 Encounters:  09/25/21 44.83 kg/m  09/19/21 44.82 kg/m  04/26/20 39.52 kg/m     Vaccines:  Shingrix: 51-64 yo and ask insurance if covered when patient above 37 yo Pneumonia: educated and discussed with patient. Flu:  educated and  discussed with patient. Received last visit  Hep C Screening: getting today STD testing and prevention (HIV/chl/gon/syphilis): getting today Intimate partner violence:none Sexual History : sexually active with husband Menstrual History/LMP/Abnormal Bleeding: [facility]; about a week ago,  long heavy periods, hx of fibroids, was seen by OBGYN, was on birth control but has not been on it for years. She said it did not really help the bleeding Incontinence Symptoms: none  Breast cancer:  - Last Mammogram: scheduled for 10/07/21 - BRCA gene screening: no  Osteoporosis: Discussed high calcium and vitamin D supplementation, weight bearing exercises  Cervical cancer screening: will get pap when she sees GYN  Skin cancer: Discussed monitoring for atypical lesions  Colorectal cancer: cologuard ordered Lung cancer:  Low Dose CT Chest recommended if Age 53-80 years, 20 pack-year currently smoking OR have quit w/in 15years. Patient does not qualify.     Advanced Care Planning: A voluntary discussion about advance care planning including the explanation and discussion of advance directives.  Discussed health care proxy and Living will, and the patient was able to identify a health care proxy as husband.    Lipids: Lab Results  Component Value Date   CHOL 164 09/19/2021   Lab Results  Component Value Date   HDL 67 09/19/2021   Lab Results  Component Value Date   LDLCALC 79 09/19/2021   Lab Results  Component Value Date   TRIG 95 09/19/2021   Lab Results  Component Value Date   CHOLHDL 2.4 09/19/2021   No results found for: LDLDIRECT  Glucose: Glucose, Bld  Date Value Ref Range Status  09/19/2021 134 (H) 65 - 99 mg/dL Final    Comment:    .            Fasting reference interval . For someone without known diabetes, a glucose value >125 mg/dL indicates that they may have diabetes and this should be confirmed with a follow-up test. .     Patient Active Problem List   Diagnosis  Date Noted   Shoulder pain, bilateral 09/19/2021   Vertigo 09/10/2021    Past Surgical History:  Procedure Laterality Date   NO PAST SURGERIES     TUBAL LIGATION      Family History  Problem Relation Age of Onset   Hypertension Mother    Healthy Mother    Hypertension Father    Heart attack Father    Hypertension Daughter    Hypertension Daughter    Hypertension Son     Social History   Socioeconomic History   Marital status: Married    Spouse name: Ray   Number of children: 3   Years of education: 12+   Highest education level: Associate degree: academic program  Occupational History   Not on file  Tobacco Use   Smoking status: Never   Smokeless tobacco: Never  Vaping Use   Vaping Use: Never used  Substance and Sexual Activity   Alcohol use: Yes    Comment: occasional   Drug use: Never   Sexual activity: Yes    Partners: Male  Other Topics Concern   Not on file  Social History Narrative   Not on file   Social Determinants of Health   Financial Resource Strain: Low Risk    Difficulty of Paying Living Expenses: Not hard at all  Food Insecurity: No Food Insecurity   Worried About Charity fundraiser in the Last Year: Never true   [place] in the Last Year: Never true  Transportation Needs: No Transportation Needs   Lack of Transportation (Medical): No   Lack of Transportation (Non-Medical): No  Physical Activity: Inactive   Days of Exercise per Week: 0 days   Minutes of Exercise per Session: 0 min  Stress: No Stress Concern Present   Feeling of Stress : Not at all  Social Connections: Socially Isolated   Frequency of Communication with Friends and Family: More than three times a week   Frequency of Social Gatherings with Friends and Family: Three times a week   Attends Religious Services: Never   Active Member of Clubs or Organizations: No   Attends Archivist Meetings: Never   Marital Status: Never married  Human resources officer  Violence: Not At Risk   Fear of Current or Ex-Partner: No   Emotionally Abused: No   Physically Abused: No   Sexually Abused: No     Current Outpatient Medications:    azelastine (ASTELIN) 0.1 % nasal spray, Place 1 spray into both nostrils 2 (two) times daily. Use in each nostril as directed, Disp: 30 mL, Rfl: 0   cetirizine (ZYRTEC) 10 MG tablet, Take 1 tablet (10 mg total) by mouth daily., Disp: 30 tablet, Rfl: 0   fluticasone (FLONASE) 50 MCG/ACT nasal spray, Place 2 sprays into  both nostrils daily., Disp: 16 g, Rfl: 6  No Known Allergies   ROS  Constitutional: Negative for fever or weight change.  Respiratory: Negative for cough and shortness of breath.   Cardiovascular: Negative for chest pain or palpitations.  Gastrointestinal: Negative for abdominal pain, no bowel changes.  Musculoskeletal: Negative for gait problem or joint swelling.  Skin: Negative for rash.  Neurological: Negative for dizziness or headache.  No other specific complaints in a complete review of systems (except as listed in HPI above).   Objective  Vitals:   09/25/21 1431  BP: 124/82  Pulse: 100  Resp: 16  Temp: 98.4 F (36.9 C)  SpO2: 97%  Weight: 253 lb 1.6 oz (114.8 kg)    Body mass index is 44.83 kg/m.  Physical Exam  Constitutional: Patient appears well-developed and well-nourished. No distress.  HENT: Head: Normocephalic and atraumatic. Ears: B TMs ok, no erythema or effusion; Nose: Nose normal. Mouth/Throat: Oropharynx is clear and moist. No oropharyngeal exudate.  Eyes: Conjunctivae and EOM are normal. Pupils are equal, round, and reactive to light. No scleral icterus.  Neck: Normal range of motion. Neck supple. No JVD present. No thyromegaly present.  Cardiovascular: Normal rate, regular rhythm and normal heart sounds.  No murmur heard. No BLE edema. Pulmonary/Chest: Effort normal and breath sounds normal. No respiratory distress. Abdominal: Soft. Bowel sounds are normal, no  distension. There is no tenderness. no masses Breast: no lumps or masses, no nipple discharge or rashes FEMALE GENITALIA: will be done by GYN RECTAL: not done Musculoskeletal: Normal range of motion, no joint effusions. No gross deformities Neurological: he is alert and oriented to person, place, and time. No cranial nerve deficit. Coordination, balance, strength, speech and gait are normal.  Skin: Skin is warm and dry. No rash noted. No erythema.  Psychiatric: Patient has a normal mood and affect. behavior is normal. Judgment and thought content normal.   Recent Results (from the past 2160 hour(s))  CBC with Differential/Platelet     Status: Abnormal   Collection Time: 09/19/21  3:17 PM  Result Value Ref Range   WBC 10.1 3.8 - 10.8 Thousand/uL   RBC 4.28 3.80 - 5.10 Million/uL   Hemoglobin 8.9 (L) 11.7 - 15.5 g/dL   HCT 31.9 (L) 35.0 - 45.0 %   MCV 74.5 (L) 80.0 - 100.0 fL   MCH 20.8 (L) 27.0 - 33.0 pg   MCHC 27.9 (L) 32.0 - 36.0 g/dL   RDW 18.2 (H) 11.0 - 15.0 %   Platelets 214 140 - 400 Thousand/uL   MPV  7.5 - 12.5 fL    Comment: Due to platelet or RBC variability in size or shape the result cannot be reported accurately.    Neutro Abs 7,939 (H) 1,500 - 7,800 cells/uL   Lymphs Abs 1,323 850 - 3,900 cells/uL   Absolute Monocytes 717 200 - 950 cells/uL   Eosinophils Absolute 91 15 - 500 cells/uL   Basophils Absolute 30 0 - 200 cells/uL   Neutrophils Relative % 78.6 %   Total Lymphocyte 13.1 %   Monocytes Relative 7.1 %   Eosinophils Relative 0.9 %   Basophils Relative 0.3 %  COMPLETE METABOLIC PANEL WITH GFR     Status: Abnormal   Collection Time: 09/19/21  3:17 PM  Result Value Ref Range   Glucose, Bld 134 (H) 65 - 99 mg/dL    Comment: .            Fasting reference interval . For someone  without known diabetes, a glucose value >125 mg/dL indicates that they may have diabetes and this should be confirmed with a follow-up test. .    BUN 10 7 - 25 mg/dL   Creat 0.53  0.50 - 1.03 mg/dL   eGFR 112 > OR = 60 mL/min/1.35m    Comment: The eGFR is based on the CKD-EPI 2021 equation. To calculate  the new eGFR from a previous Creatinine or Cystatin C result, go to https://www.kidney.org/professionals/ kdoqi/gfr%5Fcalculator    BUN/Creatinine Ratio NOT APPLICABLE 6 - 22 (calc)   Sodium 135 135 - 146 mmol/L   Potassium 4.2 3.5 - 5.3 mmol/L   Chloride 100 98 - 110 mmol/L   CO2 31 20 - 32 mmol/L   Calcium 10.6 (H) 8.6 - 10.4 mg/dL   Total Protein 6.8 6.1 - 8.1 g/dL   Albumin 4.1 3.6 - 5.1 g/dL   Globulin 2.7 1.9 - 3.7 g/dL (calc)   AG Ratio 1.5 1.0 - 2.5 (calc)   Total Bilirubin 0.4 0.2 - 1.2 mg/dL   Alkaline phosphatase (APISO) 146 37 - 153 U/L   AST 34 10 - 35 U/L   ALT 54 (H) 6 - 29 U/L  Lipid panel     Status: None   Collection Time: 09/19/21  3:17 PM  Result Value Ref Range   Cholesterol 164 <200 mg/dL   HDL 67 > OR = 50 mg/dL   Triglycerides 95 <150 mg/dL   LDL Cholesterol (Calc) 79 mg/dL (calc)    Comment: Reference range: <100 . Desirable range <100 mg/dL for primary prevention;   <70 mg/dL for patients with CHD or diabetic patients  with > or = 2 CHD risk factors. .Marland KitchenLDL-C is now calculated using the Martin-Hopkins  calculation, which is a validated novel method providing  better accuracy than the Friedewald equation in the  estimation of LDL-C.  MCresenciano Genreet al. JAnnamaria Helling [id number];[phone]: [id number]  (http://education.[facility].com/faq/FAQ164)    Total CHOL/HDL Ratio 2.4 <5.0 (calc)   Non-HDL Cholesterol (Calc) 97 <130 mg/dL (calc)    Comment: For patients with diabetes plus 1 major ASCVD risk  factor, treating to a non-HDL-C goal of <100 mg/dL  (LDL-C of <70 mg/dL) is considered a therapeutic  option.   Hemoglobin A1c     Status: None   Collection Time: 09/19/21  3:17 PM  Result Value Ref Range   Hgb A1c MFr Bld 5.4 <5.7 % of total Hgb    Comment: For the purpose of screening for the presence of diabetes: . <5.7%        Consistent with the absence of diabetes 5.7-6.4%    Consistent with increased risk for diabetes             (prediabetes) > or =6.5%  Consistent with diabetes . This assay result is consistent with a decreased risk of diabetes. . Currently, no consensus exists regarding use of hemoglobin A1c for diagnosis of diabetes in children. . According to American Diabetes Association (ADA) guidelines, hemoglobin A1c <7.0% represents optimal control in non-pregnant diabetic patients. Different metrics may apply to specific patient populations.  Standards of Medical Care in Diabetes(ADA). .    Mean Plasma Glucose 108 mg/dL   eAG (mmol/L) 6.0 mmol/L    Fall Risk: Fall Risk  09/25/2021 09/19/2021  Falls in the past year? 0 0  Number falls in past yr: 0 0  Injury with Fall? 0 0     Functional Status Survey: Is the patient deaf or have difficulty hearing?:  No Does the patient have difficulty seeing, even when wearing glasses/contacts?: No Does the patient have difficulty concentrating, remembering, or making decisions?: No Does the patient have difficulty walking or climbing stairs?: No Does the patient have difficulty dressing or bathing?: No Does the patient have difficulty doing errands alone such as visiting a doctor's office or shopping?: No   Assessment & Plan  1. Physical exam, annual   2. Anemia, unspecified type  - B12 and Folate Panel - Iron, TIBC and Ferritin Panel  3. Menorrhagia with regular cycle  - Ambulatory referral to Gynecology  4. Screening for HIV (human immunodeficiency virus)  - HIV Antibody (routine testing w rflx)  5. Need for hepatitis C screening test  - Hepatitis C antibody   -USPSTF grade A and B recommendations reviewed with patient; age-appropriate recommendations, preventive care, screening tests, etc discussed and encouraged; healthy living encouraged; see AVS for patient education given to patient -Discussed importance of 150 minutes of  physical activity weekly, eat two servings of fish weekly, eat one serving of tree nuts ( cashews, pistachios, pecans, almonds.Marland Kitchen) every other day, eat 6 servings of fruit/vegetables daily and drink plenty of water and avoid sweet beverages.

## 2021-09-26 ENCOUNTER — Other Ambulatory Visit: Payer: Self-pay | Admitting: Nurse Practitioner

## 2021-09-26 ENCOUNTER — Other Ambulatory Visit: Payer: Self-pay

## 2021-09-26 DIAGNOSIS — D649 Anemia, unspecified: Secondary | ICD-10-CM

## 2021-09-26 LAB — IRON,TIBC AND FERRITIN PANEL
%SAT: 5 % (calc) — ABNORMAL LOW (ref 16–45)
Ferritin: 3 ng/mL — ABNORMAL LOW (ref 16–232)
Iron: 24 ug/dL — ABNORMAL LOW (ref 45–160)
TIBC: 498 mcg/dL (calc) — ABNORMAL HIGH (ref 250–450)

## 2021-09-26 LAB — B12 AND FOLATE PANEL
Folate: 8.2 ng/mL
Vitamin B-12: 489 pg/mL (ref 200–1100)

## 2021-09-26 LAB — HIV ANTIBODY (ROUTINE TESTING W REFLEX): HIV 1&2 Ab, 4th Generation: NONREACTIVE

## 2021-09-26 LAB — HEPATITIS C ANTIBODY
Hepatitis C Ab: NONREACTIVE
SIGNAL TO CUT-OFF: 0.02 (ref <1.00)

## 2021-09-26 MED ORDER — IRON (FERROUS SULFATE) 325 (65 FE) MG PO TABS
325.0000 mg | ORAL_TABLET | Freq: Every day | ORAL | 3 refills | Status: AC
Start: 2021-09-26 — End: ?
  Filled 2021-09-26: qty 30, fill #0

## 2021-10-07 ENCOUNTER — Other Ambulatory Visit: Payer: Self-pay

## 2021-10-07 ENCOUNTER — Ambulatory Visit
Admission: RE | Admit: 2021-10-07 | Discharge: 2021-10-07 | Disposition: A | Discharge status: Home / Self Care | Payer: 59 | Source: Ambulatory Visit | Attending: Nurse Practitioner | Admitting: Nurse Practitioner

## 2021-10-07 DIAGNOSIS — Z1231 Encounter for screening mammogram for malignant neoplasm of breast: Secondary | ICD-10-CM | POA: Diagnosis not present

## 2021-10-10 ENCOUNTER — Other Ambulatory Visit: Payer: Self-pay | Admitting: Nurse Practitioner

## 2021-10-10 DIAGNOSIS — N6320 Unspecified lump in the left breast, unspecified quadrant: Secondary | ICD-10-CM

## 2021-10-10 DIAGNOSIS — R928 Other abnormal and inconclusive findings on diagnostic imaging of breast: Secondary | ICD-10-CM

## 2021-10-14 DIAGNOSIS — Z1211 Encounter for screening for malignant neoplasm of colon: Secondary | ICD-10-CM | POA: Diagnosis not present

## 2021-10-23 LAB — COLOGUARD: COLOGUARD: NEGATIVE

## 2021-10-31 ENCOUNTER — Other Ambulatory Visit: Payer: Self-pay

## 2021-10-31 ENCOUNTER — Ambulatory Visit
Admission: RE | Admit: 2021-10-31 | Discharge: 2021-10-31 | Disposition: A | Discharge status: Home / Self Care | Payer: 59 | Source: Ambulatory Visit | Attending: Nurse Practitioner | Admitting: Nurse Practitioner

## 2021-10-31 DIAGNOSIS — N6324 Unspecified lump in the left breast, lower inner quadrant: Secondary | ICD-10-CM | POA: Diagnosis not present

## 2021-10-31 DIAGNOSIS — R928 Other abnormal and inconclusive findings on diagnostic imaging of breast: Secondary | ICD-10-CM | POA: Insufficient documentation

## 2021-10-31 DIAGNOSIS — N6323 Unspecified lump in the left breast, lower outer quadrant: Secondary | ICD-10-CM | POA: Diagnosis not present

## 2021-10-31 DIAGNOSIS — N6320 Unspecified lump in the left breast, unspecified quadrant: Secondary | ICD-10-CM | POA: Insufficient documentation

## 2021-11-12 ENCOUNTER — Ambulatory Visit (INDEPENDENT_AMBULATORY_CARE_PROVIDER_SITE_OTHER): Payer: 59 | Admitting: Obstetrics and Gynecology

## 2021-11-12 ENCOUNTER — Other Ambulatory Visit (HOSPITAL_COMMUNITY): Payer: Self-pay

## 2021-11-12 ENCOUNTER — Encounter: Payer: Self-pay | Admitting: Obstetrics and Gynecology

## 2021-11-12 ENCOUNTER — Other Ambulatory Visit: Payer: Self-pay

## 2021-11-12 VITALS — BP 147/85 | HR 70 | Ht 63.0 in | Wt 249.8 lb

## 2021-11-12 DIAGNOSIS — N920 Excessive and frequent menstruation with regular cycle: Secondary | ICD-10-CM

## 2021-11-12 DIAGNOSIS — L83 Acanthosis nigricans: Secondary | ICD-10-CM | POA: Diagnosis not present

## 2021-11-12 DIAGNOSIS — D649 Anemia, unspecified: Secondary | ICD-10-CM

## 2021-11-12 NOTE — Progress Notes (Signed)
HPI:      [name] is a 50 y.o. [phone] who LMP was Patient's last menstrual period was 10/07/2021 (approximate).  Subjective:   She presents today stating over the last year her periods have gotten exceptionally heavy.  She says they have been heavy her whole life but much worse over the last few months.  She does report remote history of uterine fibroids.  She is not up-to-date on her Pap smear.  She also reports a previous endometrial biopsy which she says was normal. She does have some hot flashes and night sweats but her menses are regular just heavy and long.  She says she recently had a blood test done and she was found to be "anemic.  She is taking iron regularly.  She was chewing ice frequently but since beginning the iron she has noted a decreased desire. Secondarily she complains of some darkened areas between her thighs and her lower buttocks.    Hx: The following portions of the patient's history were reviewed and updated as appropriate:             She  has a past medical history of Allergy and Fibroid. She does not have any pertinent problems on file. She  has a past surgical history that includes No past surgeries and Tubal ligation. Her family history includes Heart attack in her father; Hypertension in her daughter, daughter, father, mother, and son. She  reports that she has never smoked. She has never used smokeless tobacco. She reports current alcohol use. She reports that she does not use drugs. She has a current medication list which includes the following prescription(s): azelastine, cetirizine, fluticasone, and iron (ferrous sulfate). She has No Known Allergies.       Review of Systems:  Review of Systems  Constitutional: Denied constitutional symptoms, night sweats, recent illness, fatigue, fever, insomnia and weight loss.  Eyes: Denied eye symptoms, eye pain, photophobia, vision change and visual disturbance.  Ears/Nose/Throat/Neck: Denied ear, nose,  throat or neck symptoms, hearing loss, nasal discharge, sinus congestion and sore throat.  Cardiovascular: Denied cardiovascular symptoms, arrhythmia, chest pain/pressure, edema, exercise intolerance, orthopnea and palpitations.  Respiratory: Denied pulmonary symptoms, asthma, pleuritic pain, productive sputum, cough, dyspnea and wheezing.  Gastrointestinal: Denied, gastro-esophageal reflux, melena, nausea and vomiting.  Genitourinary: See HPI for additional information.  Musculoskeletal: Denied musculoskeletal symptoms, stiffness, swelling, muscle weakness and myalgia.  Dermatologic: See HPI for additional information.  Neurologic: Denied neurology symptoms, dizziness, headache, neck pain and syncope.  Psychiatric: Denied psychiatric symptoms, anxiety and depression.  Endocrine: Denied endocrine symptoms including hot flashes and night sweats.   Meds:   Current Outpatient Medications on File Prior to Visit  Medication Sig Dispense Refill   azelastine (ASTELIN) 0.1 % nasal spray Place 1 spray into both nostrils 2 (two) times daily. Use in each nostril as directed 30 mL 0   cetirizine (ZYRTEC) 10 MG tablet Take 1 tablet (10 mg total) by mouth daily. 30 tablet 0   fluticasone (FLONASE) 50 MCG/ACT nasal spray Place 2 sprays into both nostrils daily. 16 g 6   Iron, Ferrous Sulfate, 325 (65 Fe) MG TABS Take 325 mg by mouth daily. 30 tablet 3   No current facility-administered medications on file prior to visit.      Objective:     Vitals:   11/12/21 1421  BP: (!) 147/85  Pulse: 70   Filed Weights   11/12/21 1421  Weight: 249 lb 12.8 oz (113.3 kg)  Skin examined and consistent with acantholysis          Assessment:    [id number] Patient Active Problem List   Diagnosis Date Noted   Shoulder pain, bilateral 09/19/2021   Vertigo 09/10/2021     1. Menorrhagia with regular cycle   2. Acquired acanthosis nigricans   3. Chronic anemia     Heavy menstrual bleeding  likely associated with uterine fibroids.   Plan:            1.  Ultrasound to delineate uterine fibroids and measure endometrial thickness  2.  Consider cycle control methods including methods that induce false menopause  3.  Endometrial biopsy if work-up indicates.  4.  Literature on acanthosis given Orders Orders Placed This Encounter  Procedures   US PELVIS (TRANSABDOMINAL ONLY)   US PELVIS TRANSVAGINAL NON-OB (TV ONLY)     No orders of the defined types were placed in this encounter.     F/U  Return for We will contact her with any abnormal test results. I spent 33 minutes involved in the care of this patient preparing to see the patient by obtaining and reviewing her medical history (including labs, imaging tests and prior procedures), documenting clinical information in the electronic health record (EHR), counseling and coordinating care plans, writing and sending prescriptions, ordering tests or procedures and in direct communicating with the patient and medical staff discussing pertinent items from her history and physical exam.  Finis Bud, M.D. 11/12/2021 3:06 PM

## 2021-11-12 NOTE — Progress Notes (Signed)
Patient reports heavy bleeding throughout entire cycle. Patient reports large clots during cycle as well.

## 2021-11-21 ENCOUNTER — Other Ambulatory Visit: Payer: 59

## 2021-12-16 ENCOUNTER — Other Ambulatory Visit: Payer: Self-pay

## 2021-12-16 ENCOUNTER — Ambulatory Visit (INDEPENDENT_AMBULATORY_CARE_PROVIDER_SITE_OTHER): Payer: 59

## 2021-12-16 DIAGNOSIS — N920 Excessive and frequent menstruation with regular cycle: Secondary | ICD-10-CM

## 2021-12-24 NOTE — Progress Notes (Signed)
Joanna Miller : Please have her schedule an appointment to discuss her ultrasound and her postmenopausal bleeding.  This can be a video visit if she so desires.

## 2022-01-01 ENCOUNTER — Ambulatory Visit (INDEPENDENT_AMBULATORY_CARE_PROVIDER_SITE_OTHER): Payer: 59 | Admitting: Obstetrics and Gynecology

## 2022-01-01 ENCOUNTER — Other Ambulatory Visit: Payer: Self-pay

## 2022-01-01 ENCOUNTER — Encounter: Payer: Self-pay | Admitting: Obstetrics and Gynecology

## 2022-01-01 VITALS — BP 141/77 | HR 94 | Ht 63.0 in | Wt 257.0 lb

## 2022-01-01 DIAGNOSIS — N920 Excessive and frequent menstruation with regular cycle: Secondary | ICD-10-CM

## 2022-01-01 DIAGNOSIS — D219 Benign neoplasm of connective and other soft tissue, unspecified: Secondary | ICD-10-CM

## 2022-01-01 MED ORDER — MEDROXYPROGESTERONE ACETATE 10 MG PO TABS
10.0000 mg | ORAL_TABLET | Freq: Every day | ORAL | 0 refills | Status: DC
  Filled 2022-01-01: qty 75, 90d supply, fill #0

## 2022-01-01 NOTE — Progress Notes (Signed)
HPI:      [name] is a 51 y.o. [phone] who LMP was No LMP recorded.  Subjective:   She presents today to discuss her heavy menstrual bleeding and her most recent ultrasound showing multiple large uterine fibroids some of which are submucosal.  She states that she is just ending her menstrual period and this menses has not been as bad as usual. Nevertheless, she regularly has a drop in her hemoglobin because of her heavy menstrual bleeding. She continues to have normal monthly menses i.e. is not yet in menopause. She has a tubal ligation for birth control.    Hx: The following portions of the patient's history were reviewed and updated as appropriate:             She  has a past medical history of Allergy and Fibroid. She does not have any pertinent problems on file. She  has a past surgical history that includes No past surgeries and Tubal ligation. Her family history includes Heart attack in her father; Hypertension in her daughter, daughter, father, mother, and son. She  reports that she has never smoked. She has never used smokeless tobacco. She reports current alcohol use. She reports that she does not use drugs. She has a current medication list which includes the following prescription(s): azelastine, cetirizine, fluticasone, iron (ferrous sulfate), and medroxyprogesterone. She has No Known Allergies.       Review of Systems:  Review of Systems  Constitutional: Denied constitutional symptoms, night sweats, recent illness, fatigue, fever, insomnia and weight loss.  Eyes: Denied eye symptoms, eye pain, photophobia, vision change and visual disturbance.  Ears/Nose/Throat/Neck: Denied ear, nose, throat or neck symptoms, hearing loss, nasal discharge, sinus congestion and sore throat.  Cardiovascular: Denied cardiovascular symptoms, arrhythmia, chest pain/pressure, edema, exercise intolerance, orthopnea and palpitations.  Respiratory: Denied pulmonary symptoms, asthma,  pleuritic pain, productive sputum, cough, dyspnea and wheezing.  Gastrointestinal: Denied, gastro-esophageal reflux, melena, nausea and vomiting.  Genitourinary: See HPI for additional information.  Musculoskeletal: Denied musculoskeletal symptoms, stiffness, swelling, muscle weakness and myalgia.  Dermatologic: Denied dermatology symptoms, rash and scar.  Neurologic: Denied neurology symptoms, dizziness, headache, neck pain and syncope.  Psychiatric: Denied psychiatric symptoms, anxiety and depression.  Endocrine: Denied endocrine symptoms including hot flashes and night sweats.   Meds:   Current Outpatient Medications on File Prior to Visit  Medication Sig Dispense Refill   azelastine (ASTELIN) 0.1 % nasal spray Place 1 spray into both nostrils 2 (two) times daily. Use in each nostril as directed 30 mL 0   cetirizine (ZYRTEC) 10 MG tablet Take 1 tablet (10 mg total) by mouth daily. 30 tablet 0   fluticasone (FLONASE) 50 MCG/ACT nasal spray Place 2 sprays into both nostrils daily. 16 g 6   Iron, Ferrous Sulfate, 325 (65 Fe) MG TABS Take 325 mg by mouth daily. 30 tablet 3   No current facility-administered medications on file prior to visit.      Objective:     Vitals:   01/01/22 1458  BP: (!) 141/77  Pulse: 94  SpO2: 95%   Filed Weights   01/01/22 1458  Weight: 257 lb (116.6 kg)              Ultrasound results reviewed directly with the patient          Assessment:    G3P3003 Patient Active Problem List   Diagnosis Date Noted   Shoulder pain, bilateral 09/19/2021   Vertigo 09/10/2021  1. Menorrhagia with regular cycle   2. Fibroids     Fibroids most likely causing her very heavy menstrual bleeding.  She states that she bleeds approximately 10 days/month on a regular basis.  This often causes a fall in her hemoglobin.  She would like something to control her bleeding and cramping.   Plan:            1.  We have discussed multiple methods of cycle control.   Progesterone only OCPs have been discussed, use of cyclic progesterone discussed, IUD discussed although not appropriate because of submucosal fibroids, endometrial ablation discussed although not appropriate because of patient's prior tubal ligation, UFE discussed, Myfembree discussed and finally hysterectomy as an option. She has chosen to try cyclic progesterone first.  If this fails consider use of Myfembree and if this fails consider definitive management with UFE or hysterectomy. Plan cyclic progesterone 21 days on 7 days off. Orders No orders of the defined types were placed in this encounter.    Meds ordered this encounter  Medications   medroxyPROGESTERone (PROVERA) 10 MG tablet    Sig: Take 1 tablet (10 mg total) by mouth daily. Take 1 pill for 21 days then take 7 days off-repeat    Dispense:  75 tablet    Refill:  0      F/U  Return in about 3 months (around 04/01/2022). I spent 23 minutes involved in the care of this patient preparing to see the patient by obtaining and reviewing her medical history (including labs, imaging tests and prior procedures), documenting clinical information in the electronic health record (EHR), counseling and coordinating care plans, writing and sending prescriptions, ordering tests or procedures and in direct communicating with the patient and medical staff discussing pertinent items from her history and physical exam.  Finis Bud, M.D. 01/01/2022 3:31 PM

## 2022-03-26 ENCOUNTER — Ambulatory Visit: Payer: 59 | Admitting: Nurse Practitioner

## 2022-03-26 ENCOUNTER — Encounter: Payer: Self-pay | Admitting: Nurse Practitioner

## 2022-03-26 VITALS — BP 134/72 | HR 94 | Resp 16 | Ht 62.0 in | Wt 263.0 lb

## 2022-03-26 DIAGNOSIS — N920 Excessive and frequent menstruation with regular cycle: Secondary | ICD-10-CM | POA: Diagnosis not present

## 2022-03-26 DIAGNOSIS — J302 Other seasonal allergic rhinitis: Secondary | ICD-10-CM

## 2022-03-26 DIAGNOSIS — D219 Benign neoplasm of connective and other soft tissue, unspecified: Secondary | ICD-10-CM | POA: Diagnosis not present

## 2022-03-26 DIAGNOSIS — D649 Anemia, unspecified: Secondary | ICD-10-CM | POA: Diagnosis not present

## 2022-03-26 MED ORDER — LORATADINE 10 MG PO TABS: 10.0000 mg | ORAL_TABLET | Freq: Every day | ORAL | 11 refills | Status: AC

## 2022-03-26 MED ORDER — MONTELUKAST SODIUM 10 MG PO TABS: 10.0000 mg | ORAL_TABLET | Freq: Every day | ORAL | 3 refills | Status: DC

## 2022-03-26 NOTE — Progress Notes (Signed)
? ?BP 134/72   Pulse 94   Resp 16   Ht '5\' 2"'$  (1.575 m)   Wt 263 lb (119.3 kg)   SpO2 98%   BMI 48.10 kg/m?   ? ?Subjective:  ? ? Patient ID: Joanna Miller, female    DOB: Nov 08, 1971, 51 y.o.   MRN: [id number] ? ?HPI: ?MYRAKLE WINGLER is a 51 y.o. female ? ?Chief Complaint  ?Patient presents with  ? Follow-up  ? ?Iron deficient anemia: On 09/19/2021 patient CBC showed anemia hemoglobin was 8.9 hematocrit was 31.9.  Iron panel was run iron was low at 24 TIBC was elevated at 498 saturation was 5 and ferritin was 3.  Referred patient to GYN for menorrhagia started patient on iron supplementation. She denies any constipation with the iron.  Will get labs today to recheck levels. ? ?Menorrhagia/fibroids: She saw OB/GYN (Dr. Amalia Hailey) on 01/01/2022.  Pelvis ultrasound done December 16, 2021.  Ultrasound showed fibroid 1 measuring 8.9 x 8.7 x 8.3 cm fibroid 2 measuring 4.2 x 4.3 x 3.2 cm and fibroid 3 measuring 3.4 x 2.9 x 3.4 cm.  Dr. Amalia Hailey explain options for treatment patient decided on taking medroxyprogesterone. She says that her period has still been very heavy.  She is scheduled for follow-up next week 04/01/2022. ? ?Seasonal allergies: nasal congestion, runny nose, dry scratchy throat, and itchy ears.  She is currently taking Flonase.  She says her allergies are worse this year than ever.  She says she is to take Zyrtec and Allegra but she does not feel like it works for her anymore.  We will send in prescription for Claritin and Singulair.  ? ?Relevant past medical, surgical, family and social history reviewed and updated as indicated. Interim medical history since our last visit reviewed. ?Allergies and medications reviewed and updated. ? ?Review of Systems ? ?Constitutional: Negative for fever or weight change.  ?Respiratory: Negative for cough and shortness of breath.   ?Cardiovascular: Negative for chest pain or palpitations.  ?Gastrointestinal: Negative for abdominal pain, no bowel changes.  ?Musculoskeletal:  Negative for gait problem or joint swelling.  ?Skin: Negative for rash.  ?Neurological: Negative for dizziness or headache.  ?No other specific complaints in a complete review of systems (except as listed in HPI above).  ? ?   ?Objective:  ?  ?BP 134/72   Pulse 94   Resp 16   Ht '5\' 2"'$  (1.575 m)   Wt 263 lb (119.3 kg)   SpO2 98%   BMI 48.10 kg/m?   ?Wt Readings from Last 3 Encounters:  ?03/26/22 263 lb (119.3 kg)  ?01/01/22 257 lb (116.6 kg)  ?11/12/21 249 lb 12.8 oz (113.3 kg)  ?  ?Physical Exam ? ?Constitutional: Patient appears well-developed and well-nourished. Obese  No distress.  ?HEENT: head atraumatic, normocephalic, pupils equal and reactive to light, neck supple ?Cardiovascular: Normal rate, regular rhythm and normal heart sounds.  No murmur heard. No BLE edema. ?Pulmonary/Chest: Effort normal and breath sounds normal. No respiratory distress. ?Abdominal: Soft.  There is no tenderness. ?Psychiatric: Patient has a normal mood and affect. behavior is normal. Judgment and thought content normal.  ?Results for orders placed or performed in visit on 09/25/21  ?B12 and Folate Panel  ?Result Value Ref Range  ? Vitamin B-12 489 200 - 1,100 pg/mL  ? Folate 8.2 ng/mL  ?Iron, TIBC and Ferritin Panel  ?Result Value Ref Range  ? Iron 24 (L) 45 - 160 mcg/dL  ? TIBC 498 (H) 250 -  450 mcg/dL (calc)  ? %SAT 5 (L) 16 - 45 % (calc)  ? Ferritin 3 (L) 16 - 232 ng/mL  ?HIV Antibody (routine testing w rflx)  ?Result Value Ref Range  ? HIV 1&2 Ab, 4th Generation NON-REACTIVE NON-REACTIVE  ?Hepatitis C antibody  ?Result Value Ref Range  ? Hepatitis C Ab NON-REACTIVE NON-REACTIVE  ? SIGNAL TO CUT-OFF 0.02 <1.00  ? ?   ?Assessment & Plan:  ? ?1. Anemia, iron deficiency ?Continue to take iron supplement ?- CBC with Differential/Platelet ?- Iron, TIBC and Ferritin Panel ? ?2. Menorrhagia with regular cycle ?Keep follow-up appointment with GYN ? ?3. Fibroids ?Keep follow-up appointment with ? GYN ?4. Seasonal allergies ?Continue  taking Flonase ?- loratadine (CLARITIN) 10 MG tablet; Take 1 tablet (10 mg total) by mouth daily.  Dispense: 30 tablet; Refill: 11 ?- montelukast (SINGULAIR) 10 MG tablet; Take 1 tablet (10 mg total) by mouth at bedtime.  Dispense: 90 tablet; Refill: 3  ? ?Follow up plan: ?Return in about 6 months (around 09/25/2022) for follow up. ? ? ? ? ? ?

## 2022-03-27 LAB — CBC WITH DIFFERENTIAL/PLATELET
Absolute Monocytes: 640 cells/uL (ref 200–950)
Basophils Absolute: 41 cells/uL (ref 0–200)
Basophils Relative: 0.5 %
Eosinophils Absolute: 107 cells/uL (ref 15–500)
Eosinophils Relative: 1.3 %
HCT: 38.1 % (ref 35.0–45.0)
Hemoglobin: 11.9 g/dL (ref 11.7–15.5)
Lymphs Abs: 1361 cells/uL (ref 850–3900)
MCH: 28.3 pg (ref 27.0–33.0)
MCHC: 31.2 g/dL — ABNORMAL LOW (ref 32.0–36.0)
MCV: 90.7 fL (ref 80.0–100.0)
MPV: 13 fL — ABNORMAL HIGH (ref 7.5–12.5)
Monocytes Relative: 7.8 %
Neutro Abs: 6052 cells/uL (ref 1500–7800)
Neutrophils Relative %: 73.8 %
Platelets: 186 10*3/uL (ref 140–400)
RBC: 4.2 10*6/uL (ref 3.80–5.10)
RDW: 15.2 % — ABNORMAL HIGH (ref 11.0–15.0)
Total Lymphocyte: 16.6 %
WBC: 8.2 10*3/uL (ref 3.8–10.8)

## 2022-03-27 LAB — IRON,TIBC AND FERRITIN PANEL
%SAT: 10 % (calc) — ABNORMAL LOW (ref 16–45)
Ferritin: 21 ng/mL (ref 16–232)
Iron: 42 ug/dL — ABNORMAL LOW (ref 45–160)
TIBC: 419 mcg/dL (calc) (ref 250–450)

## 2022-04-01 ENCOUNTER — Ambulatory Visit (INDEPENDENT_AMBULATORY_CARE_PROVIDER_SITE_OTHER): Payer: 59 | Admitting: Obstetrics and Gynecology

## 2022-04-01 ENCOUNTER — Other Ambulatory Visit: Payer: Self-pay

## 2022-04-01 ENCOUNTER — Encounter: Payer: Self-pay | Admitting: Obstetrics and Gynecology

## 2022-04-01 VITALS — BP 133/80 | HR 92 | Ht 62.0 in | Wt 261.1 lb

## 2022-04-01 DIAGNOSIS — N920 Excessive and frequent menstruation with regular cycle: Secondary | ICD-10-CM

## 2022-04-01 DIAGNOSIS — D219 Benign neoplasm of connective and other soft tissue, unspecified: Secondary | ICD-10-CM | POA: Diagnosis not present

## 2022-04-01 MED ORDER — MYFEMBREE 40-1-0.5 MG PO TABS
1.0000 | ORAL_TABLET | Freq: Every day | ORAL | 3 refills | Status: DC
  Filled 2022-04-01 – 2022-06-17 (×7): qty 28, 28d supply, fill #0
  Filled 2022-07-13: qty 28, 28d supply, fill #1
  Filled 2022-08-14: qty 28, 28d supply, fill #2
  Filled 2022-09-09: qty 28, 28d supply, fill #3

## 2022-04-01 NOTE — Progress Notes (Signed)
Patient presents today for a 3 month follow-up regarding fibroids. She states since last visit she has used the progesterone and it has been working well for her. Patient states two days of her last cycle were so heavy she did have to miss work due to lack of energy and heavy blood flow. Patient states no other complaints at this time.  ?

## 2022-04-01 NOTE — Progress Notes (Signed)
HPI: ?     [name] is a 51 y.o. [phone] who LMP was Patient's last menstrual period was 02/20/2022 (approximate). ? ?Subjective:  ? ?She presents today stating that she took the progesterone as directed.  She has had 3 very heavy periods one of which caused her to miss work because of the heavy bleeding.  She states that the only advantage of the medication is she knows exactly when she is about to get a very heavy period. ?It seems obvious that she needs something else for bleeding/cycle control. ? ?  Hx: ?The following portions of the patient's history were reviewed and updated as appropriate: ?            She  has a past medical history of Allergy and Fibroid. ?She does not have any pertinent problems on file. ?She  has a past surgical history that includes No past surgeries and Tubal ligation. ?Her family history includes Heart attack in her father; Hypertension in her daughter, daughter, father, mother, and son. ?She  reports that she has never smoked. She has never used smokeless tobacco. She reports that she does not currently use alcohol. She reports that she does not use drugs. ?She has a current medication list which includes the following prescription(s): azelastine, fluticasone, iron (ferrous sulfate), loratadine, medroxyprogesterone, montelukast, and myfembree. ?She has No Known Allergies. ?      ?Review of Systems:  ?Review of Systems ? ?Constitutional: Denied constitutional symptoms, night sweats, recent illness, fatigue, fever, insomnia and weight loss.  ?Eyes: Denied eye symptoms, eye pain, photophobia, vision change and visual disturbance.  ?Ears/Nose/Throat/Neck: Denied ear, nose, throat or neck symptoms, hearing loss, nasal discharge, sinus congestion and sore throat.  ?Cardiovascular: Denied cardiovascular symptoms, arrhythmia, chest pain/pressure, edema, exercise intolerance, orthopnea and palpitations.  ?Respiratory: Denied pulmonary symptoms, asthma, pleuritic pain, productive  sputum, cough, dyspnea and wheezing.  ?Gastrointestinal: Denied, gastro-esophageal reflux, melena, nausea and vomiting.  ?Genitourinary: See HPI for additional information.  ?Musculoskeletal: Denied musculoskeletal symptoms, stiffness, swelling, muscle weakness and myalgia.  ?Dermatologic: Denied dermatology symptoms, rash and scar.  ?Neurologic: Denied neurology symptoms, dizziness, headache, neck pain and syncope.  ?Psychiatric: Denied psychiatric symptoms, anxiety and depression.  ?Endocrine: Denied endocrine symptoms including hot flashes and night sweats.  ? ?Meds: ?  ?Current Outpatient Medications on File Prior to Visit  ?Medication Sig Dispense Refill  ? azelastine (ASTELIN) 0.1 % nasal spray Place 1 spray into both nostrils 2 (two) times daily. Use in each nostril as directed 30 mL 0  ? fluticasone (FLONASE) 50 MCG/ACT nasal spray Place 2 sprays into both nostrils daily. 16 g 6  ? Iron, Ferrous Sulfate, 325 (65 Fe) MG TABS Take 325 mg by mouth daily. 30 tablet 3  ? loratadine (CLARITIN) 10 MG tablet Take 1 tablet (10 mg total) by mouth daily. 30 tablet 11  ? medroxyPROGESTERone (PROVERA) 10 MG tablet Take 1 tablet (10 mg total) by mouth daily. Take 1 pill for 21 days then take 7 days off-repeat 75 tablet 0  ? montelukast (SINGULAIR) 10 MG tablet Take 1 tablet (10 mg total) by mouth at bedtime. 90 tablet 3  ? ?No current facility-administered medications on file prior to visit.  ? ? ? ? ?Objective:  ?  ? ?Vitals:  ? 04/01/22 1424  ?BP: 133/80  ?Pulse: 92  ? ?Filed Weights  ? 04/01/22 1424  ?Weight: 261 lb 1.6 oz (118.4 kg)  ? ?  ?          ?        ? ?  Assessment:  ?  ?G3P3003 ?Patient Active Problem List  ? Diagnosis Date Noted  ? Shoulder pain, bilateral 09/19/2021  ? Vertigo 09/10/2021  ? ?  ?1. Menorrhagia with regular cycle   ?2. Fibroids   ? ? Patient has failed trial of Provera. ? ? ?Plan:  ?  ?       ? 1.  We discussed other methods of cycle control and I have reiterated them (see last note) ? She has  chosen Myfembree. ?Orders ?No orders of the defined types were placed in this encounter. ? ?  ?Meds ordered this encounter  ?Medications  ? Relugolix-Estradiol-Norethind (MYFEMBREE) 40-1-0.5 MG TABS  ?  Sig: Take 1 tablet by mouth daily.  ?  Dispense:  28 tablet  ?  Refill:  3  ?  ?  F/U ? Return in about 3 months (around 07/01/2022). ?I spent 23 minutes involved in the care of this patient preparing to see the patient by obtaining and reviewing her medical history (including labs, imaging tests and prior procedures), documenting clinical information in the electronic health record (EHR), counseling and coordinating care plans, writing and sending prescriptions, ordering tests or procedures and in direct communicating with the patient and medical staff discussing pertinent items from her history and physical exam. ? ?Finis Bud, M.D. ?04/01/2022 ?3:05 PM ? ? ? ? ?

## 2022-04-10 ENCOUNTER — Other Ambulatory Visit: Payer: Self-pay

## 2022-04-16 ENCOUNTER — Other Ambulatory Visit: Payer: Self-pay

## 2022-04-21 ENCOUNTER — Telehealth: Payer: Self-pay | Admitting: Obstetrics and Gynecology

## 2022-04-21 NOTE — Telephone Encounter (Signed)
Patient called and is inquiring about the prior authorization for RX myfembree. Pt states that the pharmacy has sent over the information to the provider to get the authorization processed. Please advise.  ?

## 2022-04-22 NOTE — Telephone Encounter (Signed)
Spoke with [employer] to initiate enrollment. Representative states she will be faxing an enrollment form. Will await for fax to proceed.  ?

## 2022-04-23 NOTE — Telephone Encounter (Signed)
Spoke with patient regarding forms. She is going to come in to the office to sign paperwork. Also picking up samples as well.  ?

## 2022-04-24 ENCOUNTER — Telehealth: Payer: Self-pay | Admitting: Obstetrics and Gynecology

## 2022-04-24 NOTE — Telephone Encounter (Signed)
Forms been competed, awaiting Dr signature and then it will be sent ?

## 2022-04-24 NOTE — Telephone Encounter (Signed)
Form received, awaiting for patient signature.  ?

## 2022-04-24 NOTE — Telephone Encounter (Signed)
Joanna Miller with Myfembre program called asking if form was received for this pt and if there were any questions.  ?

## 2022-04-28 ENCOUNTER — Other Ambulatory Visit: Payer: Self-pay | Admitting: Nurse Practitioner

## 2022-04-28 DIAGNOSIS — J302 Other seasonal allergic rhinitis: Secondary | ICD-10-CM

## 2022-04-28 NOTE — Telephone Encounter (Signed)
Pt called in for assistance. Pt says that she was told by her pharmacy that her insurance will only cover her Rx being sent to a community pharmacy.  ? ?Pt would like to have her Rx for montelukast (SINGULAIR) 10 MG tablet  to be sent to  ? ? ?Pharmacy:  ?[facility] Phone:  [phone]  ?Fax:  [phone]  ?  ? ? ?Future: 09/25/22 ?

## 2022-04-29 ENCOUNTER — Other Ambulatory Visit: Payer: Self-pay

## 2022-04-29 MED ORDER — MONTELUKAST SODIUM 10 MG PO TABS
10.0000 mg | ORAL_TABLET | Freq: Every day | ORAL | 3 refills | Status: DC
  Filled 2022-04-29: qty 90, 90d supply, fill #0
  Filled 2022-07-23: qty 90, 90d supply, fill #1
  Filled 2022-10-21: qty 90, 90d supply, fill #2
  Filled 2023-01-28: qty 90, 90d supply, fill #3

## 2022-04-29 NOTE — Telephone Encounter (Signed)
Requested Prescriptions  ?Pending Prescriptions Disp Refills  ?? montelukast (SINGULAIR) 10 MG tablet 90 tablet 3  ?  Sig: Take 1 tablet (10 mg total) by mouth at bedtime.  ?  ? Pulmonology:  Leukotriene Inhibitors Passed - 04/28/2022  3:56 PM  ?  ?  Passed - Valid encounter within last 12 months  ?  Recent Outpatient Visits   ?      ? 1 month ago Anemia, iron deficiency  ? [facility] Bo Merino, FNP  ? 7 months ago Physical exam, annual  ? [facility] Bo Merino, FNP  ? 7 months ago Encounter for medical examination to establish care  ? [facility] Bo Merino, FNP  ?  ?  ?Future Appointments   ?        ? In 4 months Reece Packer, Myna Hidalgo, [facility], [place]  ?  ? ?  ?  ?  ? ?

## 2022-05-01 NOTE — Telephone Encounter (Signed)
Prior auth started, clinical information faxed and confirmed.

## 2022-05-05 ENCOUNTER — Telehealth: Payer: Self-pay | Admitting: Obstetrics and Gynecology

## 2022-05-05 NOTE — Telephone Encounter (Signed)
[place] called asking if you submitted a PA for medication Myfembre- she asked for call back - #: [phone] direct ext [phone].

## 2022-05-05 NOTE — Telephone Encounter (Signed)
Spoke with representative. Will be submitting auth with alternative route.

## 2022-05-06 NOTE — Telephone Encounter (Signed)
[employer] Prior Josem Kaufmann has been approved. Authorization is for 6 fills from 05/02/22-10/29/22. Ref Number: [id number]  Patient made aware of status and that she may contact her pharmacy for medication.

## 2022-05-12 ENCOUNTER — Telehealth: Payer: Self-pay | Admitting: Obstetrics and Gynecology

## 2022-05-12 NOTE — Telephone Encounter (Signed)
A representative by the name of [place] from [place] called in reference to this patient. She was calling to confirm if Prior authorization was sent for Myfembree medication. [name]'s contact information is [phone] Ext [phone]. Hours of operation are M-F 8a-8p. Please contact to confirm.

## 2022-05-12 NOTE — Telephone Encounter (Signed)
LVM for representative with all needed information.

## 2022-05-15 ENCOUNTER — Other Ambulatory Visit: Payer: Self-pay

## 2022-05-18 ENCOUNTER — Other Ambulatory Visit: Payer: Self-pay

## 2022-06-07 ENCOUNTER — Other Ambulatory Visit: Payer: Self-pay

## 2022-06-15 ENCOUNTER — Other Ambulatory Visit: Payer: Self-pay

## 2022-06-17 ENCOUNTER — Other Ambulatory Visit: Payer: Self-pay

## 2022-06-18 ENCOUNTER — Other Ambulatory Visit: Payer: Self-pay

## 2022-07-01 ENCOUNTER — Ambulatory Visit (INDEPENDENT_AMBULATORY_CARE_PROVIDER_SITE_OTHER): Payer: 59 | Admitting: Obstetrics and Gynecology

## 2022-07-01 ENCOUNTER — Other Ambulatory Visit: Payer: Self-pay

## 2022-07-01 ENCOUNTER — Encounter: Payer: Self-pay | Admitting: Obstetrics and Gynecology

## 2022-07-01 VITALS — BP 136/83 | HR 82 | Ht 62.0 in | Wt 264.2 lb

## 2022-07-01 DIAGNOSIS — D219 Benign neoplasm of connective and other soft tissue, unspecified: Secondary | ICD-10-CM

## 2022-07-01 DIAGNOSIS — N920 Excessive and frequent menstruation with regular cycle: Secondary | ICD-10-CM | POA: Diagnosis not present

## 2022-07-01 MED ORDER — TRANEXAMIC ACID 650 MG PO TABS
1300.0000 mg | ORAL_TABLET | Freq: Three times a day (TID) | ORAL | 2 refills | Status: DC
  Filled 2022-07-01: qty 30, 5d supply, fill #0

## 2022-07-01 NOTE — Progress Notes (Signed)
Patient presents today to discuss medication follow-up. She states with new medication her cramping has decreased however she is still heavily bleeding with clots and having hot flashes. She states no other concerns at this time.

## 2022-07-01 NOTE — Progress Notes (Addendum)
HPI:      Joanna Miller is a 51 y.o. [phone] who LMP was Patient's last menstrual period was 06/17/2022 (approximate).  Subjective:   She presents today stating that her cramping has become better with Myfembree but she continues to have very heavy long-lasting menses.  She states that in fact she is on 2-1/2 weeks of strict bleeding today.  She reports that it is often heavy and she feels like she might be getting weak.  She also states that she believes she has begun the trip to menopause because she has been having significant hot flashes on and off for the last 3 months.    Hx: The following portions of the patient's history were reviewed and updated as appropriate:             She  has a past medical history of Allergy and Fibroid. She does not have any pertinent problems on file. She  has a past surgical history that includes No past surgeries and Tubal ligation. Her family history includes Heart attack in her father; Hypertension in her daughter, daughter, father, mother, and son. She  reports that she has never smoked. She has never used smokeless tobacco. She reports that she does not currently use alcohol. She reports that she does not use drugs. She has a current medication list which includes the following prescription(s): azelastine, fluticasone, iron (ferrous sulfate), loratadine, montelukast, myfembree, and tranexamic acid. She has No Known Allergies.       Review of Systems:  Review of Systems  Constitutional: Denied constitutional symptoms, night sweats, recent illness, fatigue, fever, insomnia and weight loss.  Eyes: Denied eye symptoms, eye pain, photophobia, vision change and visual disturbance.  Ears/Nose/Throat/Neck: Denied ear, nose, throat or neck symptoms, hearing loss, nasal discharge, sinus congestion and sore throat.  Cardiovascular: Denied cardiovascular symptoms, arrhythmia, chest pain/pressure, edema, exercise intolerance, orthopnea and palpitations.   Respiratory: Denied pulmonary symptoms, asthma, pleuritic pain, productive sputum, cough, dyspnea and wheezing.  Gastrointestinal: Denied, gastro-esophageal reflux, melena, nausea and vomiting.  Genitourinary: See HPI for additional information.  Musculoskeletal: Denied musculoskeletal symptoms, stiffness, swelling, muscle weakness and myalgia.  Dermatologic: Denied dermatology symptoms, rash and scar.  Neurologic: Denied neurology symptoms, dizziness, headache, neck pain and syncope.  Psychiatric: Denied psychiatric symptoms, anxiety and depression.  Endocrine: Denied endocrine symptoms including hot flashes and night sweats.   Meds:   Current Outpatient Medications on File Prior to Visit  Medication Sig Dispense Refill   azelastine (ASTELIN) 0.1 % nasal spray Place 1 spray into both nostrils 2 (two) times daily. Use in each nostril as directed 30 mL 0   fluticasone (FLONASE) 50 MCG/ACT nasal spray Place 2 sprays into both nostrils daily. 16 g 6   Iron, Ferrous Sulfate, 325 (65 Fe) MG TABS Take 325 mg by mouth daily. 30 tablet 3   loratadine (CLARITIN) 10 MG tablet Take 1 tablet (10 mg total) by mouth daily. 30 tablet 11   montelukast (SINGULAIR) 10 MG tablet Take 1 tablet (10 mg total) by mouth at bedtime. 90 tablet 3   Relugolix-Estradiol-Norethind (MYFEMBREE) 40-1-0.5 MG TABS Take 1 tablet by mouth daily. 28 tablet 3   No current facility-administered medications on file prior to visit.      Objective:     Vitals:   07/01/22 1459  BP: 136/83  Pulse: 82   Filed Weights   07/01/22 1459  Weight: 264 lb 3.2 oz (119.8 kg)  Assessment:    G3P3003 Patient Active Problem List   Diagnosis Date Noted   Shoulder pain, bilateral 09/19/2021   Vertigo 09/10/2021     1. Menorrhagia with regular cycle   2. Fibroids     Myfembree does not seem to be helping significantly with her very heavy menstrual bleeding.  I have informed her that because of her  heavy irregular bleeding that we can wait no longer and I have strongly recommended an endometrial biopsy. I also find it significant that she is approaching menopause because she has exceeded the normal average age of menopause and has not began to have hot flashes.  This makes me think that should her biopsy be negative we may be able to find something temporarily to help with her menses so that in the next 6 months to year she can go into menopause and her bleeding will naturally resolved.   Plan:            1.  TXA to control her bleeding -endometrial biopsy next week I have discussed the rationale in detail with the patient and she is in agreement  2.  Patient is considering hysterectomy or other means of possibly temporizing her bleeding until she reaches "menopause".  3.  Although a small submucosal fibroid is likely present possible use of IUD short-term simply to help decrease her bleeding until she reaches her true menopause. Orders No orders of the defined types were placed in this encounter.   No orders of the defined types were placed in this encounter.     F/U  Return in about 1 week (around 07/08/2022). I spent 21 minutes involved in the care of this patient preparing to see the patient by obtaining and reviewing her medical history (including labs, imaging tests and prior procedures), documenting clinical information in the electronic health record (EHR), counseling and coordinating care plans, writing and sending prescriptions, ordering tests or procedures and in direct communicating with the patient and medical staff discussing pertinent items from her history and physical exam.  Finis Bud, M.D. 07/01/2022 3:30 PM

## 2022-07-08 ENCOUNTER — Other Ambulatory Visit (HOSPITAL_COMMUNITY)
Admission: RE | Admit: 2022-07-08 | Discharge: 2022-07-08 | Disposition: A | Discharge status: Home / Self Care | Payer: 59 | Source: Ambulatory Visit | Attending: Obstetrics and Gynecology | Admitting: Obstetrics and Gynecology

## 2022-07-08 ENCOUNTER — Encounter: Payer: Self-pay | Admitting: Obstetrics and Gynecology

## 2022-07-08 ENCOUNTER — Ambulatory Visit (INDEPENDENT_AMBULATORY_CARE_PROVIDER_SITE_OTHER): Payer: 59 | Admitting: Obstetrics and Gynecology

## 2022-07-08 VITALS — BP 130/84 | HR 88 | Ht 62.0 in | Wt 265.0 lb

## 2022-07-08 DIAGNOSIS — N920 Excessive and frequent menstruation with regular cycle: Secondary | ICD-10-CM | POA: Diagnosis not present

## 2022-07-08 DIAGNOSIS — D219 Benign neoplasm of connective and other soft tissue, unspecified: Secondary | ICD-10-CM

## 2022-07-08 DIAGNOSIS — N879 Dysplasia of cervix uteri, unspecified: Secondary | ICD-10-CM | POA: Diagnosis not present

## 2022-07-08 NOTE — Progress Notes (Signed)
Patient presents for endometrial biopsy. She states completing TXA, today she is lightly bleeding. No other questions or concerns.

## 2022-07-08 NOTE — Progress Notes (Signed)
HPI:      [name] is a 51 y.o. [phone] who LMP was Patient's last menstrual period was 06/17/2022 (approximate).  Subjective:   She presents today she recently took TXA because her bleeding would not stop.  Her bleeding has now become just "daily spotting".  She has been bleeding 2 out of every 4 weeks/month.  She does have uterine fibroids.  Some smaller ones appear to be submucosal on ultrasound examination. She presents today for endometrial biopsy.    Hx: The following portions of the patient's history were reviewed and updated as appropriate:             She  has a past medical history of Allergy and Fibroid. She does not have any pertinent problems on file. She  has a past surgical history that includes No past surgeries and Tubal ligation. Her family history includes Heart attack in her father; Hypertension in her daughter, daughter, father, mother, and son. She  reports that she has never smoked. She has never used smokeless tobacco. She reports that she does not currently use alcohol. She reports that she does not use drugs. She has a current medication list which includes the following prescription(s): azelastine, fluticasone, iron (ferrous sulfate), loratadine, montelukast, myfembree, and tranexamic acid. She has No Known Allergies.       Review of Systems:  Review of Systems  Constitutional: Denied constitutional symptoms, night sweats, recent illness, fatigue, fever, insomnia and weight loss.  Eyes: Denied eye symptoms, eye pain, photophobia, vision change and visual disturbance.  Ears/Nose/Throat/Neck: Denied ear, nose, throat or neck symptoms, hearing loss, nasal discharge, sinus congestion and sore throat.  Cardiovascular: Denied cardiovascular symptoms, arrhythmia, chest pain/pressure, edema, exercise intolerance, orthopnea and palpitations.  Respiratory: Denied pulmonary symptoms, asthma, pleuritic pain, productive sputum, cough, dyspnea and wheezing.   Gastrointestinal: Denied, gastro-esophageal reflux, melena, nausea and vomiting.  Genitourinary: See HPI for additional information.  Musculoskeletal: Denied musculoskeletal symptoms, stiffness, swelling, muscle weakness and myalgia.  Dermatologic: Denied dermatology symptoms, rash and scar.  Neurologic: Denied neurology symptoms, dizziness, headache, neck pain and syncope.  Psychiatric: Denied psychiatric symptoms, anxiety and depression.  Endocrine: Denied endocrine symptoms including hot flashes and night sweats.   Meds:   Current Outpatient Medications on File Prior to Visit  Medication Sig Dispense Refill   azelastine (ASTELIN) 0.1 % nasal spray Place 1 spray into both nostrils 2 (two) times daily. Use in each nostril as directed 30 mL 0   fluticasone (FLONASE) 50 MCG/ACT nasal spray Place 2 sprays into both nostrils daily. 16 g 6   Iron, Ferrous Sulfate, 325 (65 Fe) MG TABS Take 325 mg by mouth daily. 30 tablet 3   loratadine (CLARITIN) 10 MG tablet Take 1 tablet (10 mg total) by mouth daily. 30 tablet 11   montelukast (SINGULAIR) 10 MG tablet Take 1 tablet (10 mg total) by mouth at bedtime. 90 tablet 3   Relugolix-Estradiol-Norethind (MYFEMBREE) 40-1-0.5 MG TABS Take 1 tablet by mouth daily. 28 tablet 3   tranexamic acid (LYSTEDA) 650 MG TABS tablet Take 2 tablets (1,300 mg total) by mouth 3 (three) times daily. Take during menses for a maximum of five days 30 tablet 2   No current facility-administered medications on file prior to visit.      Objective:     Vitals:   07/08/22 0950  BP: 130/84  Pulse: 88   Filed Weights   07/08/22 0950  Weight: 265 lb (120.2 kg)  Physical examination   Pelvic:   Vulva: Normal appearance.  No lesions.  Vagina: No lesions or abnormalities noted.  Support: Normal pelvic support.  Urethra No masses tenderness or scarring.  Meatus Normal size without lesions or prolapse.  Cervix: Normal appearance.  No lesions.  Anus:  Normal exam.  No lesions.  Perineum: Normal exam.  No lesions.        Bimanual   Uterus: Normal size.  Non-tender.  Mobile.  AV.  Adnexae: No masses.  Non-tender to palpation.  Cul-de-sac: Negative for abnormality.   Endometrial Biopsy After discussion with the patient regarding her abnormal uterine bleeding I recommended that she proceed with an endometrial biopsy for further diagnosis. The risks, benefits, alternatives, and indications for an endometrial biopsy were discussed with the patient in detail. She understood the risks including infection, bleeding, cervical laceration and uterine perforation.  Verbal consent was obtained.   PROCEDURE NOTE:  Vacurette endometrial biopsy was performed using aseptic technique with iodine preparation.  The uterus was sounded to a length of greater than 10 cm cm.  Adequate sampling  was attempted with numerous passes of the [place].  Biopsy seems to be mostly blood.  The patient tolerated the procedure well.  Disposition will be pending pathology           Assessment:    G3P3003 Patient Active Problem List   Diagnosis Date Noted   Shoulder pain, bilateral 09/19/2021   Vertigo 09/10/2021     1. Menorrhagia with regular cycle   2. Fibroids     Patient currently taking Myfembree but she does not think it has been helping with her heavy menstrual bleeding.   Plan:            1.  Endometrial biopsy performed-will await results and decide management after they return. Orders No orders of the defined types were placed in this encounter.   No orders of the defined types were placed in this encounter.     F/U  Return for We will contact her with any abnormal test results. I spent 25 minutes involved in the care of this patient preparing to see the patient by obtaining and reviewing her medical history (including labs, imaging tests and prior procedures), documenting clinical information in the electronic health record (EHR), counseling and  coordinating care plans, writing and sending prescriptions, ordering tests or procedures and in direct communicating with the patient and medical staff discussing pertinent items from her history and physical exam.  Finis Bud, M.D. 07/08/2022 10:47 AM

## 2022-07-09 LAB — SURGICAL PATHOLOGY

## 2022-07-12 IMAGING — MG MM DIGITAL SCREENING BILAT W/ TOMO AND CAD
6 of 10 series · 6 of 30 positions shown · non-contrast
Comparison: None.

CLINICAL DATA: Screening. This is the patient's initial baseline
mammogram.

EXAM:
DIGITAL SCREENING BILATERAL MAMMOGRAM WITH TOMOSYNTHESIS AND CAD
TECHNIQUE: Bilateral screening digital craniocaudal and mediolateral oblique
mammograms were obtained. Bilateral screening digital breast
tomosynthesis was performed. The images were evaluated with
computer-aided detection.

[L CC synth-2D]
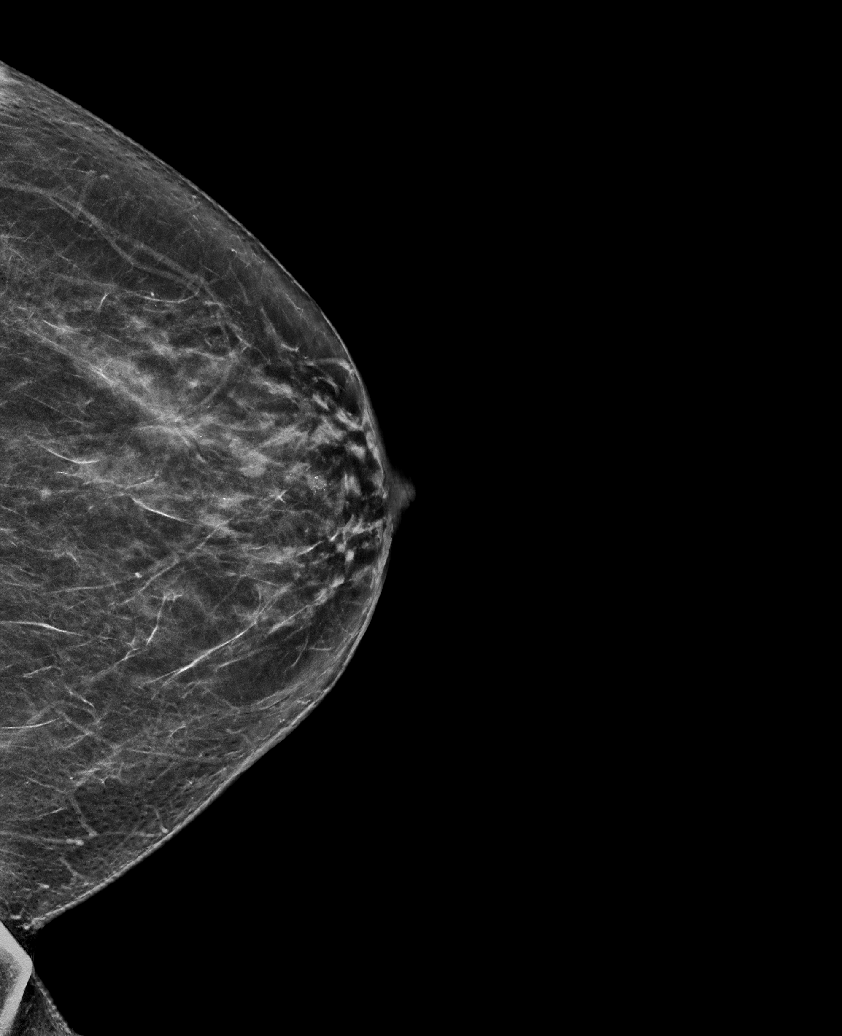

[R MLO synth-2D]
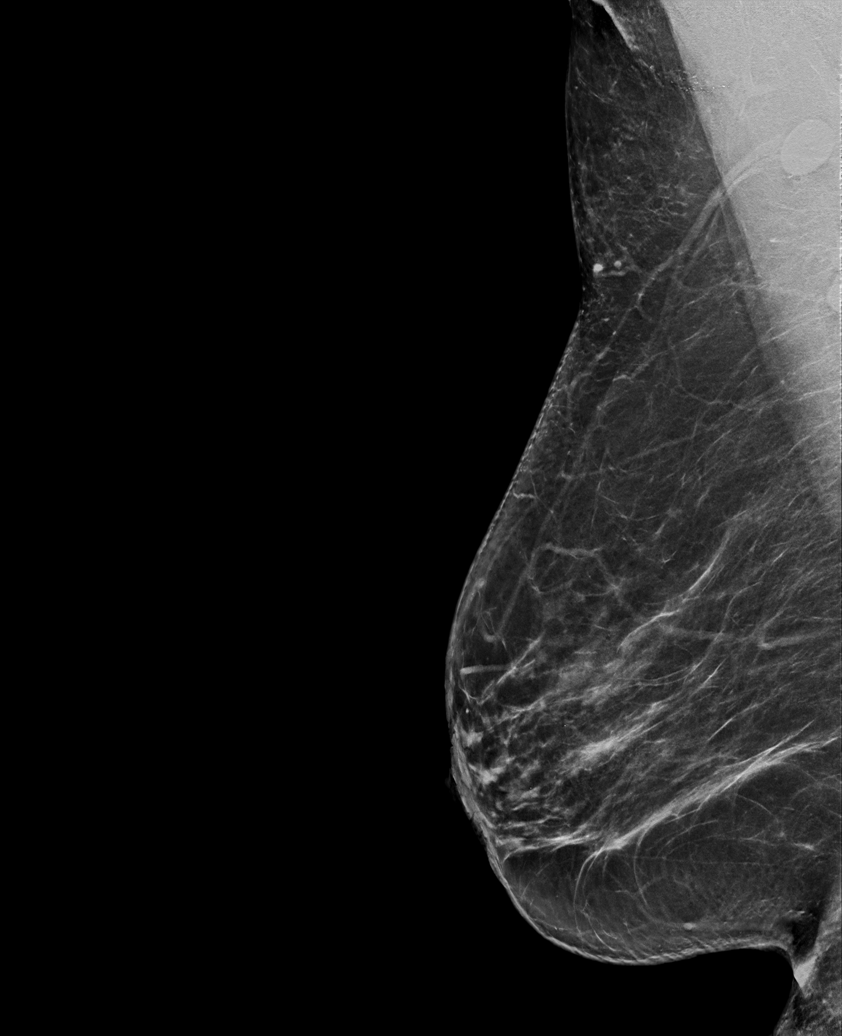

[R CC synth-2D (1 of 2)]
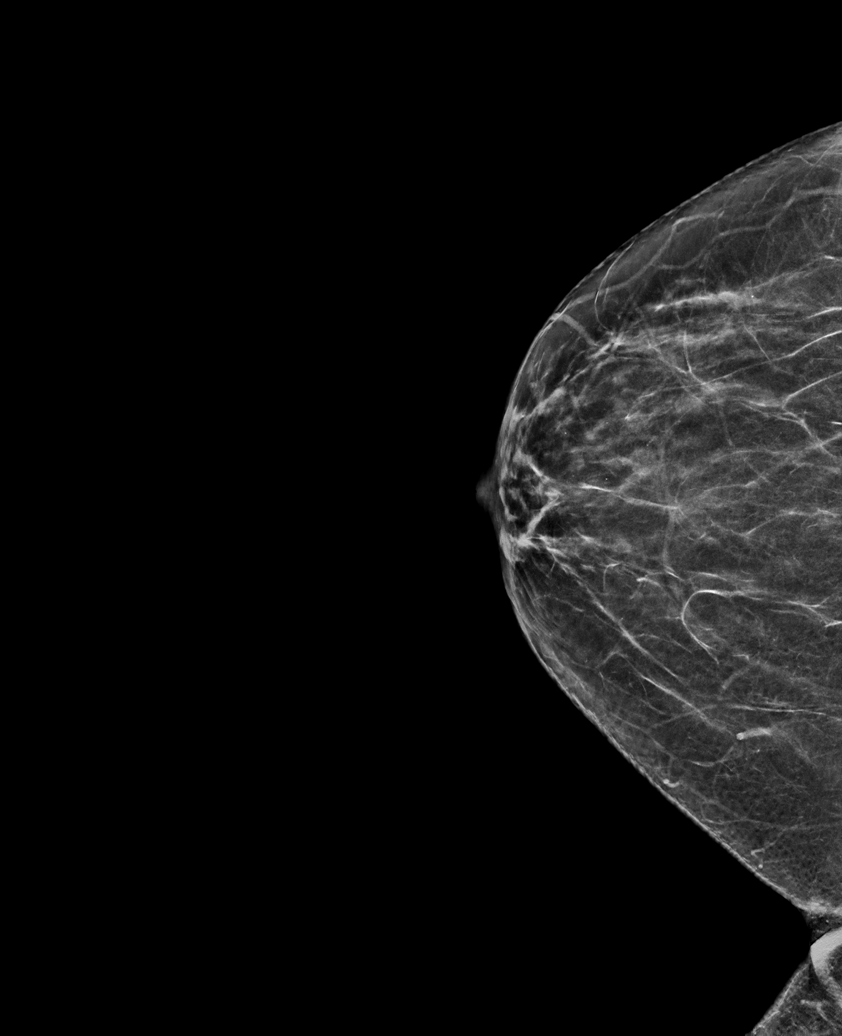

[L MLO synth-2D]
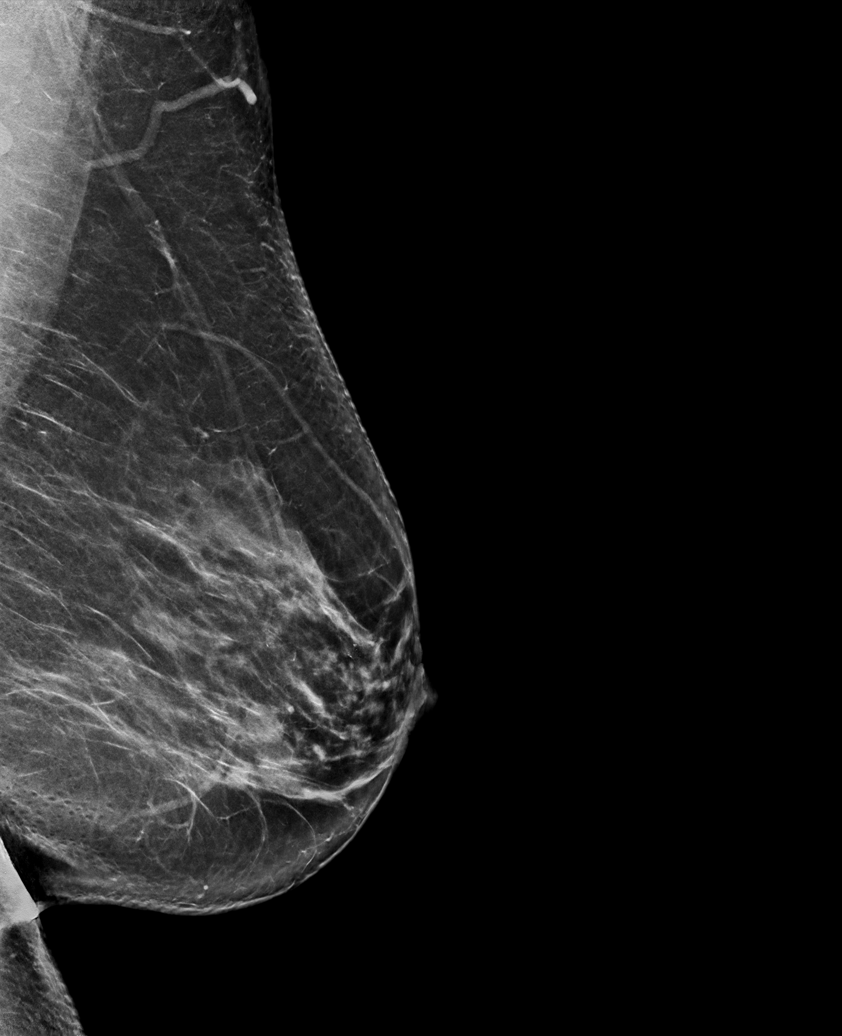

[R CC synth-2D (2 of 2)]
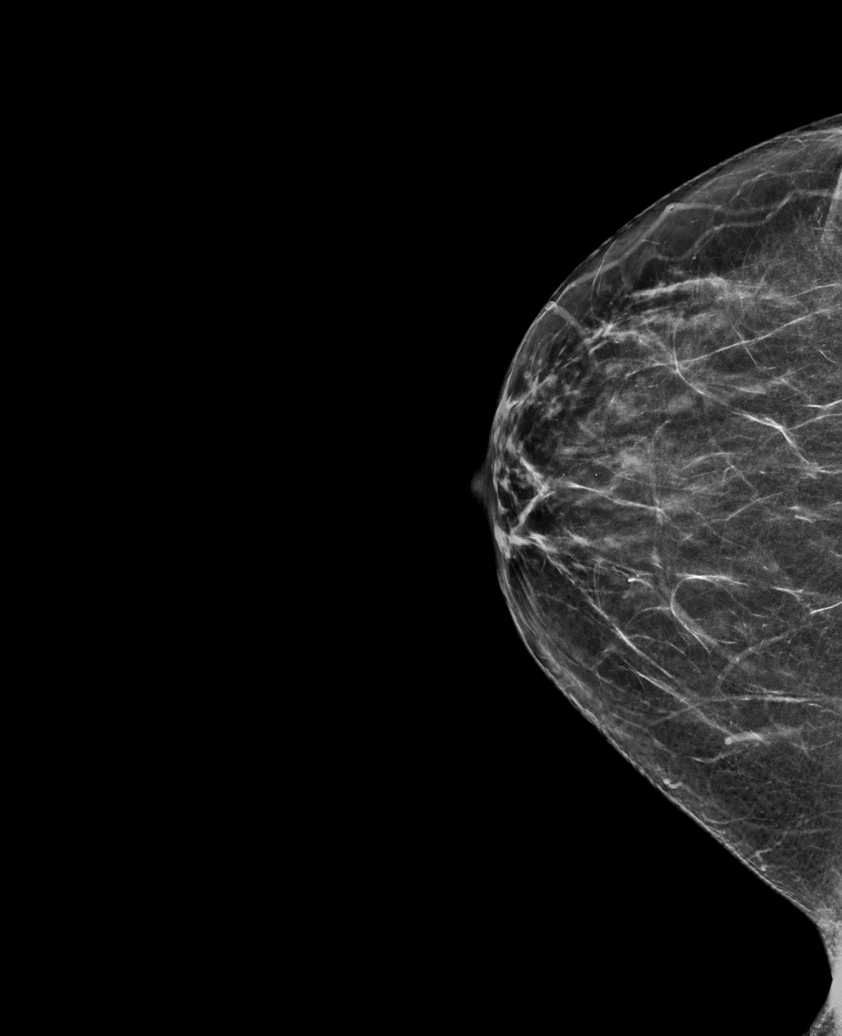

[L MLO tomo · tomo slice 41/81.0]
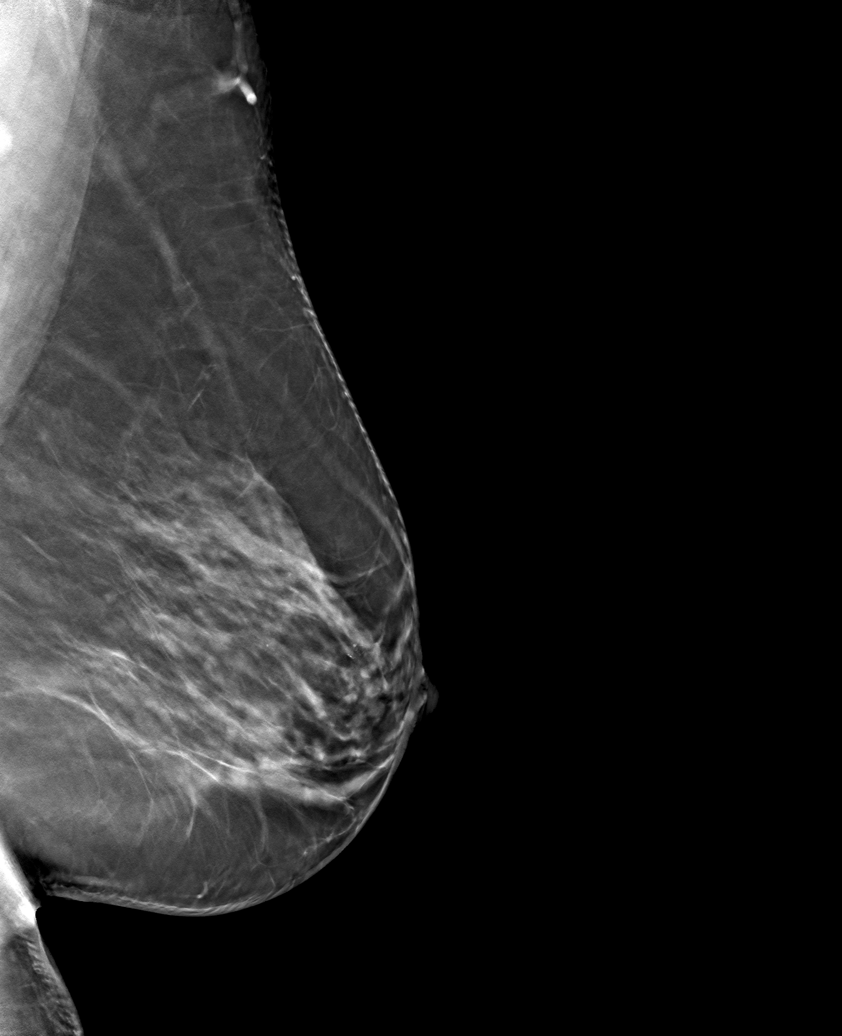

[6 of 30 positions shown; findings below may reference images not displayed]

ACR Breast Density Category b: There are scattered areas of
fibroglandular density.
FINDINGS: In the left breast, a possible adjacent masses warrant further
evaluation. In the right breast, no findings suspicious for
malignancy.
IMPRESSION: Further evaluation is suggested for possible adjacent masses in the
left breast.

RECOMMENDATION:
Ultrasound of the left breast. (Code:[id number]-TTI)

The patient will be contacted regarding the findings, and additional
imaging will be scheduled.

BI-RADS CATEGORY  0: Incomplete. Need additional imaging evaluation
and/or prior mammograms for comparison.

## 2022-07-13 NOTE — Progress Notes (Signed)
Pam: Your endometrial biopsy does not show any hyperplasia or evidence of cancer.  I think continuing the Myfembree for a little bit longer trial is a good idea.  If this does not work we can consider other options to attempt to control your bleeding. Thank you Dr. Amalia Hailey

## 2022-07-14 ENCOUNTER — Other Ambulatory Visit: Payer: Self-pay

## 2022-07-15 ENCOUNTER — Other Ambulatory Visit: Payer: Self-pay

## 2022-07-16 ENCOUNTER — Other Ambulatory Visit: Payer: Self-pay

## 2022-07-23 ENCOUNTER — Other Ambulatory Visit: Payer: Self-pay

## 2022-08-05 IMAGING — US US BREAST*L* LIMITED INC AXILLA
1 series · 8 of 8 positions shown · non-contrast
Comparison: Screening mammogram, 10/07/2021

CLINICAL DATA: Screening recall for 2 possible masses in the left
breast. The current screening study was the patient's baseline exam.

EXAM:
ULTRASOUND OF THE LEFT BREAST

[Series 1: us breast*left* limited inc axilla · 0.05mm/px · 8 of 8 slices shown]
[im 1/8]
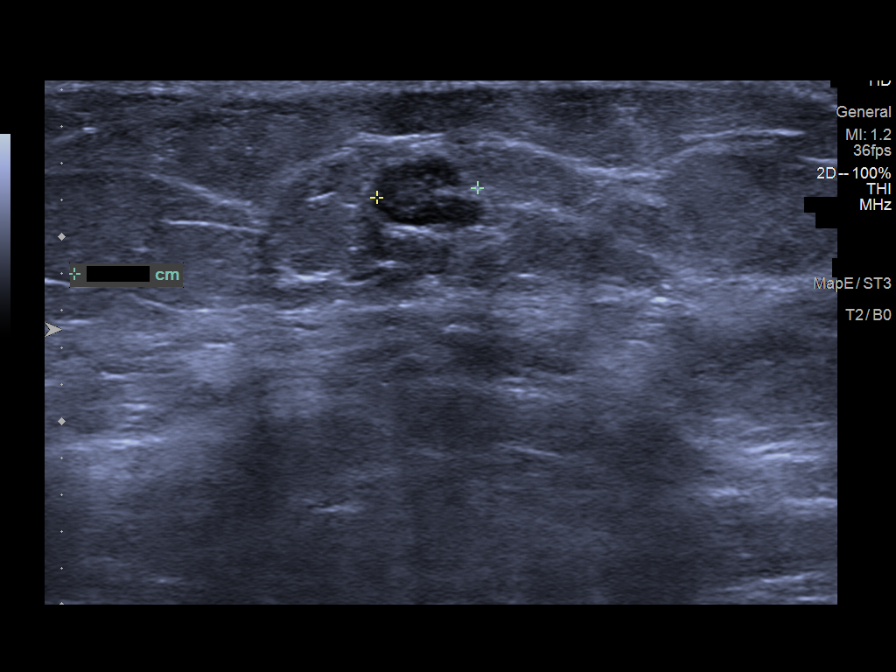
[im 2/8]
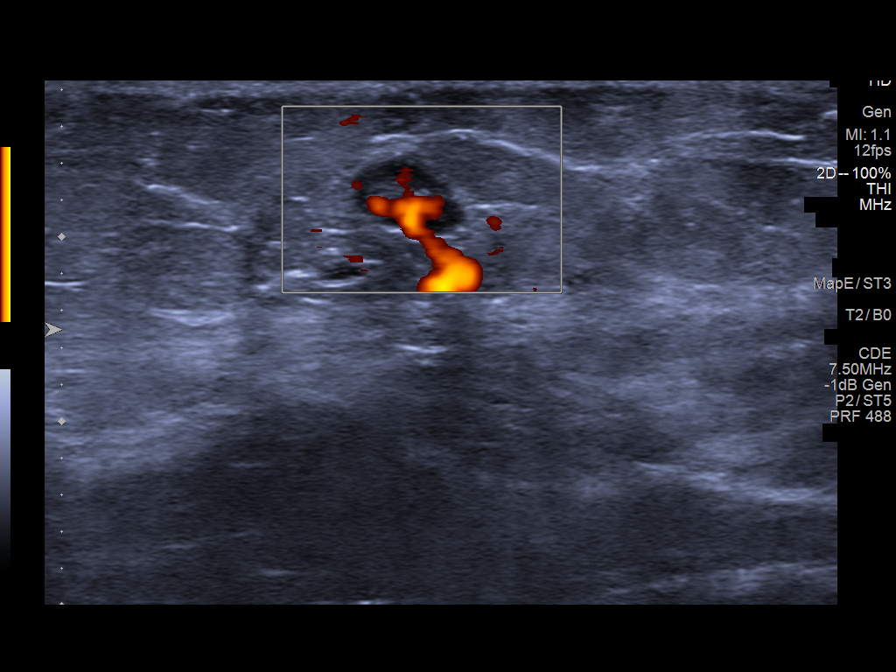
[im 3/8]
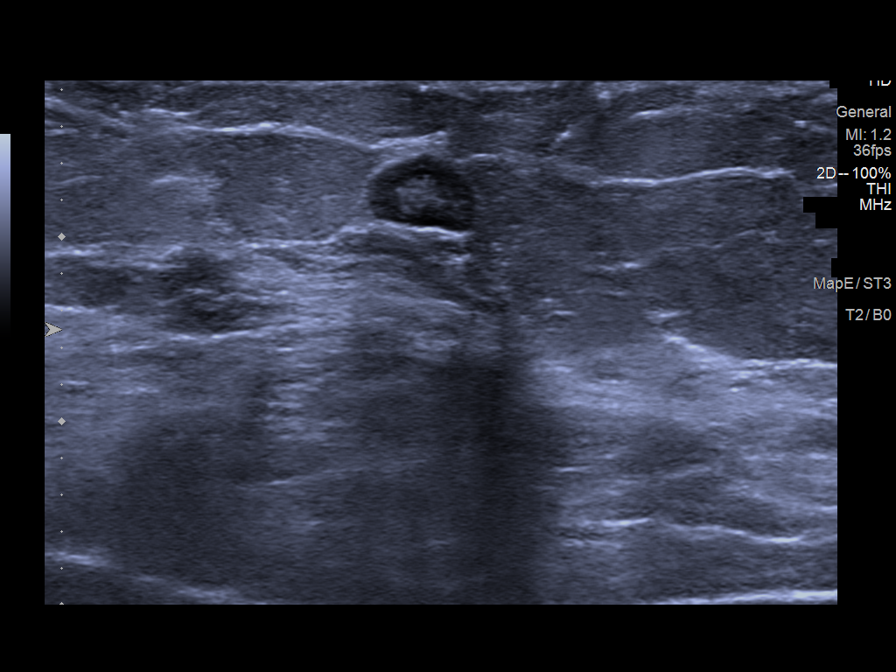
[im 4/8]
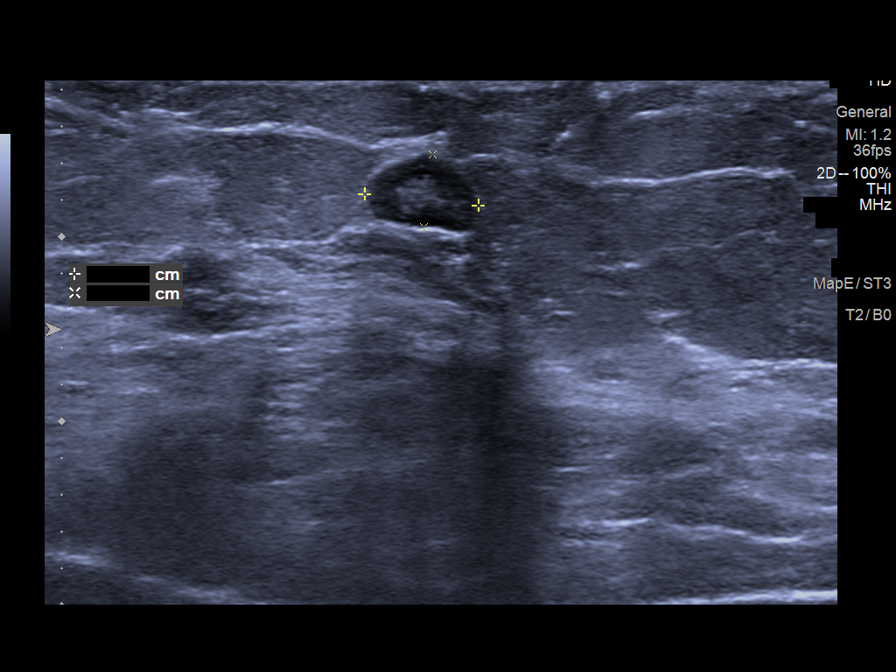
[im 5/8]
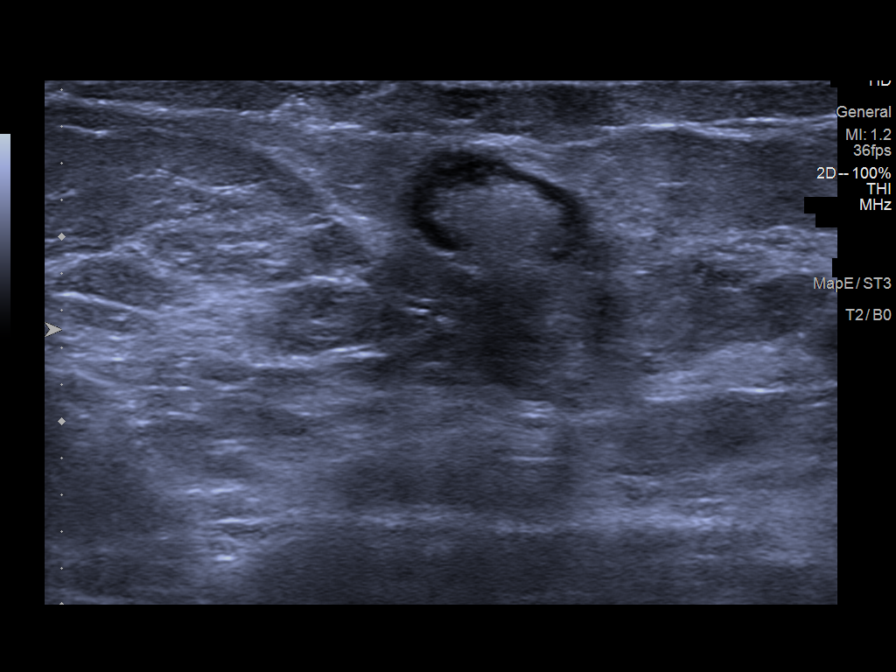
[im 6/8]
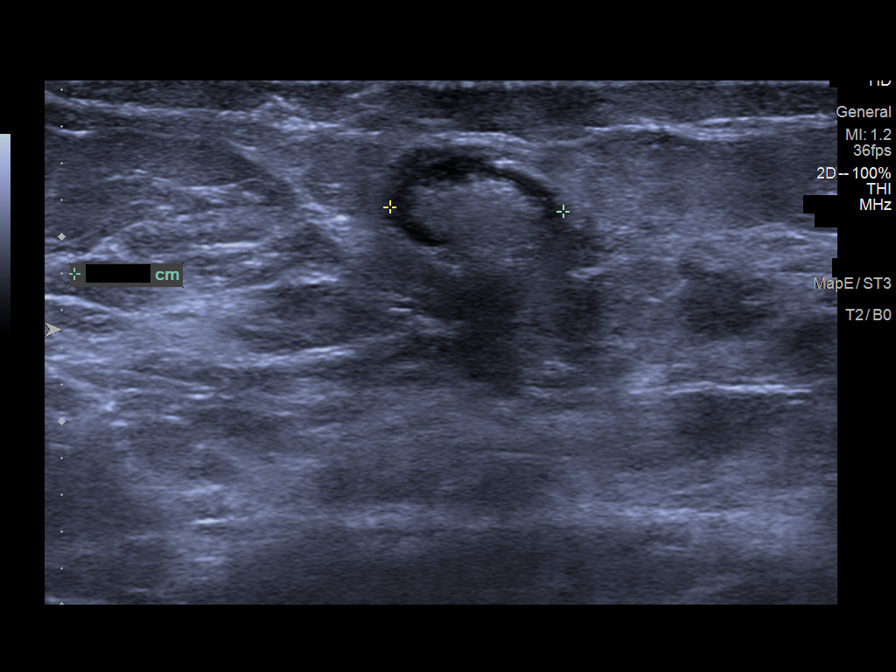
[im 7/8]
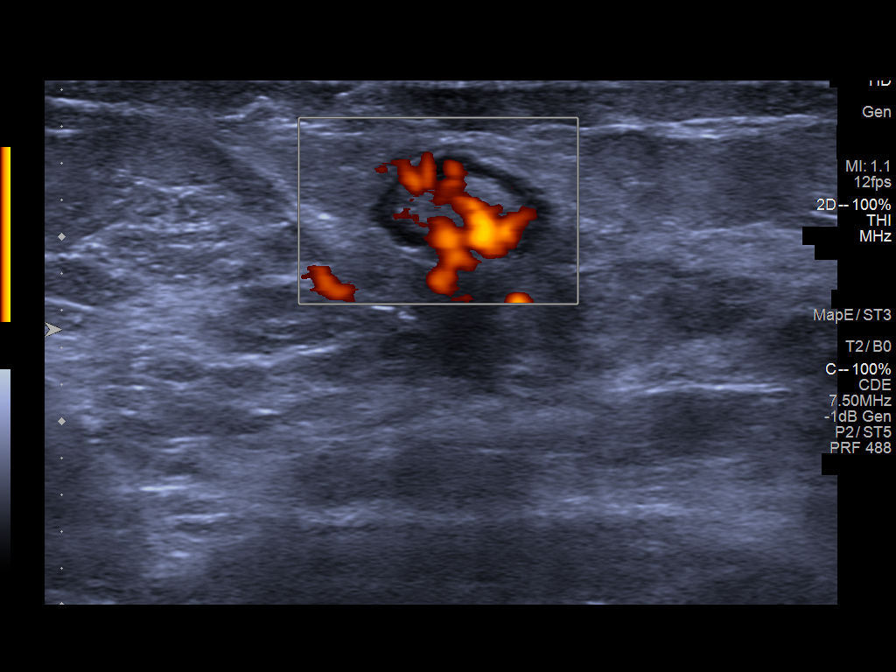
[im 8/8]
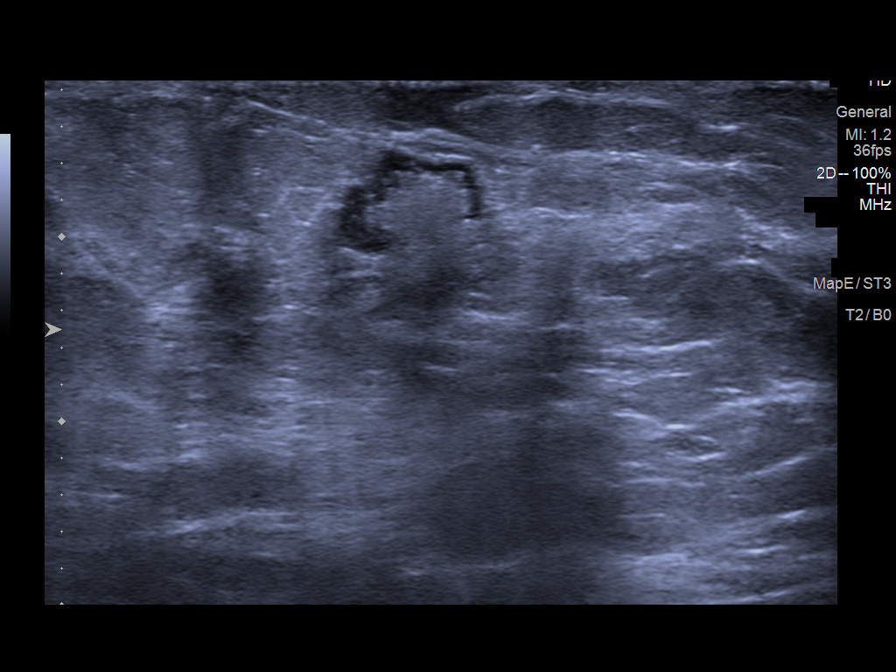

[8 of 8 positions shown; findings below may reference images not displayed]

FINDINGS: Targeted left breast ultrasound is performed, showing 2 adjacent
normal intramammary lymph nodes. The larger, lying closer to the
nipple, 6 o'clock, 1 cm from the nipple, measures 9 x 5 x 8 mm,
cortex between 1 and 2 mm. The smaller lymph node lies more
posteriorly, 6 o'clock, 2 cm the nipple, measuring 6 x 4 x 6 mm,
cortex measuring less than 2 mm. These 2 lymph nodes correspond in
size, shape and location to the mammographic masses.
IMPRESSION: 1. No evidence of breast malignancy.
2. Two normal intramammary lymph nodes in the left breast at 6
o'clock, 1-2 cm the nipple, corresponding to the mammographic
masses.

RECOMMENDATION:
Screening mammogram in one year.(Code:EQ-V-66X)

I have discussed the findings and recommendations with the patient.
If applicable, a reminder letter will be sent to the patient
regarding the next appointment.

BI-RADS CATEGORY  1: Negative.

## 2022-08-14 ENCOUNTER — Other Ambulatory Visit: Payer: Self-pay

## 2022-08-18 ENCOUNTER — Other Ambulatory Visit: Payer: Self-pay

## 2022-09-09 ENCOUNTER — Other Ambulatory Visit: Payer: Self-pay

## 2022-09-10 ENCOUNTER — Other Ambulatory Visit: Payer: Self-pay

## 2022-09-25 ENCOUNTER — Ambulatory Visit: Payer: 59 | Admitting: Nurse Practitioner

## 2022-09-25 ENCOUNTER — Other Ambulatory Visit: Payer: Self-pay

## 2022-09-25 ENCOUNTER — Encounter: Payer: Self-pay | Admitting: Nurse Practitioner

## 2022-09-25 VITALS — BP 132/76 | HR 98 | Temp 98.6°F | Resp 16 | Ht 62.0 in | Wt 265.5 lb

## 2022-09-25 DIAGNOSIS — N920 Excessive and frequent menstruation with regular cycle: Secondary | ICD-10-CM

## 2022-09-25 DIAGNOSIS — D219 Benign neoplasm of connective and other soft tissue, unspecified: Secondary | ICD-10-CM | POA: Diagnosis not present

## 2022-09-25 DIAGNOSIS — Z13220 Encounter for screening for lipoid disorders: Secondary | ICD-10-CM | POA: Diagnosis not present

## 2022-09-25 DIAGNOSIS — Z131 Encounter for screening for diabetes mellitus: Secondary | ICD-10-CM | POA: Diagnosis not present

## 2022-09-25 DIAGNOSIS — D649 Anemia, unspecified: Secondary | ICD-10-CM | POA: Insufficient documentation

## 2022-09-25 DIAGNOSIS — Z6841 Body Mass Index (BMI) 40.0 and over, adult: Secondary | ICD-10-CM | POA: Diagnosis not present

## 2022-09-25 DIAGNOSIS — J302 Other seasonal allergic rhinitis: Secondary | ICD-10-CM | POA: Diagnosis not present

## 2022-09-25 NOTE — Progress Notes (Signed)
BP 132/76   Pulse 98   Temp 98.6 F (37 C) (Oral)   Resp 16   Ht '5\' 2"'$  (1.575 m)   Wt 265 lb 8 oz (120.4 kg)   SpO2 97%   BMI 48.56 kg/m    Subjective:    Patient ID: Joanna Miller, female    DOB: 1971/12/03, 51 y.o.   MRN: [id number]  HPI: Joanna Miller is a 51 y.o. female  Chief Complaint  Patient presents with   Anemia   Allergic Rhinitis     6 month follow up   Anemia/fibroids/menorrhagia: Patient is currnetly being seen by OB/GYN Dr. Amalia Hailey.  Last seen on 07/08/2022. Endometrial biopsy was performed.  Which was negative for malignancy.  Recommended to continue myfembree. Her last H/H was back in normal range.  Her iron had improved.  She says she is still having vaginal bleeding.  She says she has done everything that Dr. Amalia Hailey has wanted her to do.  She has reached out to him regarding having a hysterectomy. She is going to continue to follow up with him.  She is also taking iron 325 mg daily.  Allergies: She currently takes Claritin 10 mg daily, Singulair 10 mg daily, Astelin daily and Flonase daily. Patient reports she is doing well.   Obesity:  Her weight today is 265 lbs with a BMI of 48.56.  Recommend eating a well balanced diet with portion control and increasing physical activity as tolerated. She says she is going to get back into walking. She says she is eating healthier.  Relevant past medical, surgical, family and social history reviewed and updated as indicated. Interim medical history since our last visit reviewed. Allergies and medications reviewed and updated.  Review of Systems  Constitutional: Negative for fever or weight change.  Respiratory: Negative for cough and shortness of breath.   Cardiovascular: Negative for chest pain or palpitations.  Gastrointestinal: Negative for abdominal pain, no bowel changes.  Musculoskeletal: Negative for gait problem or joint swelling.  Skin: Negative for rash.  Neurological: Negative for dizziness or headache.   No other specific complaints in a complete review of systems (except as listed in HPI above).      Objective:    BP 132/76   Pulse 98   Temp 98.6 F (37 C) (Oral)   Resp 16   Ht '5\' 2"'$  (1.575 m)   Wt 265 lb 8 oz (120.4 kg)   SpO2 97%   BMI 48.56 kg/m   Wt Readings from Last 3 Encounters:  09/25/22 265 lb 8 oz (120.4 kg)  07/08/22 265 lb (120.2 kg)  07/01/22 264 lb 3.2 oz (119.8 kg)    Physical Exam  Constitutional: Patient appears well-developed and well-nourished. Obese  No distress.  HEENT: head atraumatic, normocephalic, pupils equal and reactive to light, neck supple Cardiovascular: Normal rate, regular rhythm and normal heart sounds.  No murmur heard. No BLE edema. Pulmonary/Chest: Effort normal and breath sounds normal. No respiratory distress. Abdominal: Soft.  There is no tenderness. Psychiatric: Patient has a normal mood and affect. behavior is normal. Judgment and thought content normal.   Results for orders placed or performed in visit on 07/08/22  Surgical pathology  Result Value Ref Range   SURGICAL PATHOLOGY      SURGICAL PATHOLOGY CASE: [id number] PATIENT: [facility] Surgical Pathology Report     Clinical History: menorrhagia with regular cycle (cm)     FINAL MICROSCOPIC DIAGNOSIS:  A. ENDOMETRIUM, BIOPSY: Shedding endometrium from  anovulatory bleeding. Negative for hyperplasia, malignancy, polyp and endometritis. Fragments of endocervical tissue with squamous metaplasia. Negative for SIL and glandular neoplasia.   GROSS DESCRIPTION:  Received in formalin are multiple tan to hemorrhagic soft tissue fragments measuring 3.3 x 2.5 x 0.3 cm in aggregate.  The specimen is entirely submitted in 2 blocks. (KW, 07/08/2022)   Final Diagnosis performed by Unknown Jim, MD.   Electronically signed 07/09/2022 Technical and / or Professional components performed at [facility]. [facility], [place] [address], [place], [place] [address].   Immunohistochemistry Technical component (if applicable) was performed at [facility]. [address] [place], [place], [place] [address].   IMMUNOHISTOCHEMISTRY DISCLAIMER (if applicable): Some of these immunohistochemical stains may have been developed and the performance characteristics determine by [facility]. Some may not have been cleared or approved by the U.S. Food and Drug Administration. The FDA has determined that such clearance or approval is not necessary. This test is used for clinical purposes. It should not be regarded as investigational or for research. This laboratory is certified under the [employer] (CLIA-88) as qualified to perform high complexity clinical laboratory testing.  The controls stained appropriately.       Assessment & Plan:   Problem List Items Addressed This Visit       Other   Anemia - Primary    Continue taking iron supplementation.Being followed by OB/GYN Dr. Amalia Hailey.  She was last seen on 07/08/2022 she had an endometrial biopsy which was negative for malignancy.  She has tried Myfembree and is still having vaginal bleeding.  Patient would like to have a hysterectomy.  Patient states she has reached out to Dr. Amalia Hailey office but has not heard anything back.  She is going to continue to try and follow-up with him.      Relevant Orders   CBC with Differential/Platelet   Iron, TIBC and Ferritin Panel   Menorrhagia with regular cycle    Being followed by OB/GYN Dr. Amalia Hailey.  She was last seen on 07/08/2022 she had an endometrial biopsy which was negative for malignancy.  She has tried Myfembree and is still having vaginal bleeding.  Patient would like to have a hysterectomy.  Patient states she has reached out to Dr. Amalia Hailey office but has not heard anything back.  She is going to continue to try and follow-up with him.      Relevant Orders   CBC with Differential/Platelet   Iron,  TIBC and Ferritin Panel   Fibroids    Being followed by OB/GYN Dr. Amalia Hailey.  She was last seen on 07/08/2022 she had an endometrial biopsy which was negative for malignancy.  She has tried Myfembree and is still having vaginal bleeding.  Patient would like to have a hysterectomy.  Patient states she has reached out to Dr. Amalia Hailey office but has not heard anything back.  She is going to continue to try and follow-up with him.      Relevant Orders   CBC with Differential/Platelet   Iron, TIBC and Ferritin Panel   Seasonal allergies    Continue taking Claritin 10 mg daily, Singulair 10 mg daily, Astelin and Flonase nasal spray.      Relevant Orders   Iron, TIBC and Ferritin Panel   Class 3 severe obesity due to excess calories with serious comorbidity and body mass index (BMI) of 45.0 to 49.9 in adult [facility])    She reports she is working  on eating healthier and she is going to increase her physical activity by walking.      Relevant Orders   CBC with Differential/Platelet   Iron, TIBC and Ferritin Panel   COMPLETE METABOLIC PANEL WITH GFR   Lipid panel   Hemoglobin A1c   Other Visit Diagnoses     Screening for cholesterol level       Relevant Orders   Lipid panel   Screening for diabetes mellitus       Relevant Orders   Hemoglobin A1c        Follow up plan: Return in about 6 months (around 03/27/2023) for follow up.

## 2022-09-25 NOTE — Assessment & Plan Note (Signed)
Being followed by OB/GYN Dr. Amalia Hailey.  She was last seen on 07/08/2022 she had an endometrial biopsy which was negative for malignancy.  She has tried Myfembree and is still having vaginal bleeding.  Patient would like to have a hysterectomy.  Patient states she has reached out to Dr. Amalia Hailey office but has not heard anything back.  She is going to continue to try and follow-up with him.

## 2022-09-25 NOTE — Assessment & Plan Note (Signed)
She reports she is working on eating healthier and she is going to increase her physical activity by walking.

## 2022-09-25 NOTE — Assessment & Plan Note (Signed)
Continue taking iron supplementation.Being followed by OB/GYN Dr. Amalia Hailey.  She was last seen on 07/08/2022 she had an endometrial biopsy which was negative for malignancy.  She has tried Myfembree and is still having vaginal bleeding.  Patient would like to have a hysterectomy.  Patient states she has reached out to Dr. Amalia Hailey office but has not heard anything back.  She is going to continue to try and follow-up with him.

## 2022-09-25 NOTE — Assessment & Plan Note (Signed)
Continue taking Claritin 10 mg daily, Singulair 10 mg daily, Astelin and Flonase nasal spray.

## 2022-10-21 ENCOUNTER — Other Ambulatory Visit: Payer: Self-pay

## 2022-11-11 ENCOUNTER — Encounter: Payer: Self-pay | Admitting: Nurse Practitioner

## 2023-04-07 NOTE — Progress Notes (Unsigned)
   There were no vitals taken for this visit.   Subjective:    Patient ID: Joanna Miller, female    DOB: 10-04-1971, 52 y.o.   MRN: [id number]  HPI: Joanna Miller is a 52 y.o. female  No chief complaint on file.   Relevant past medical, surgical, family and social history reviewed and updated as indicated. Interim medical history since our last visit reviewed. Allergies and medications reviewed and updated.  Review of Systems  Constitutional: Negative for fever or weight change.  Respiratory: Negative for cough and shortness of breath.   Cardiovascular: Negative for chest pain or palpitations.  Gastrointestinal: Negative for abdominal pain, no bowel changes.  Musculoskeletal: Negative for gait problem or joint swelling.  Skin: Negative for rash.  Neurological: Negative for dizziness or headache.  No other specific complaints in a complete review of systems (except as listed in HPI above).      Objective:    There were no vitals taken for this visit.  Wt Readings from Last 3 Encounters:  09/25/22 265 lb 8 oz (120.4 kg)  07/08/22 265 lb (120.2 kg)  07/01/22 264 lb 3.2 oz (119.8 kg)    Physical Exam  Constitutional: Patient appears well-developed and well-nourished. Obese *** No distress.  HEENT: head atraumatic, normocephalic, pupils equal and reactive to light, ears ***, neck supple, throat within normal limits Cardiovascular: Normal rate, regular rhythm and normal heart sounds.  No murmur heard. No BLE edema. Pulmonary/Chest: Effort normal and breath sounds normal. No respiratory distress. Abdominal: Soft.  There is no tenderness. Psychiatric: Patient has a normal mood and affect. behavior is normal. Judgment and thought content normal.  Results for orders placed or performed in visit on 07/08/22  Surgical pathology  Result Value Ref Range   SURGICAL PATHOLOGY      SURGICAL PATHOLOGY CASE: [id number] PATIENT: [facility] Surgical Pathology  Report     Clinical History: menorrhagia with regular cycle (cm)     FINAL MICROSCOPIC DIAGNOSIS:  A. ENDOMETRIUM, BIOPSY: Shedding endometrium from anovulatory bleeding. Negative for hyperplasia, malignancy, polyp and endometritis. Fragments of endocervical tissue with squamous metaplasia. Negative for SIL and glandular neoplasia.   GROSS DESCRIPTION:  Received in formalin are multiple tan to hemorrhagic soft tissue fragments measuring 3.3 x 2.5 x 0.3 cm in aggregate.  The specimen is entirely submitted in 2 blocks. (KW, 07/08/2022)   Final Diagnosis performed by Arloa Koh, MD.   Electronically signed 07/09/2022 Technical and / or Professional components performed at [facility]. [facility], [address] [place], [place] [address].  Immunohistochemistry Technical component (if applicable) was performed at [facility]. [address], [place], [place] [address].   IMMUNOHISTOCHEMISTRY DISCLAIMER (if applicable): Some of these immunohistochemical stains may have been developed and the performance characteristics determine by [facility]. Some may not have been cleared or approved by the U.S. Food and Drug Administration. The FDA has determined that such clearance or approval is not necessary. This test is used for clinical purposes. It should not be regarded as investigational or for research. This laboratory is certified under the Clinical Laboratory Improvement Amendments of 1988 (CLIA-88) as qualified to perform high complexity clinical laboratory testing.  The controls stained appropriately.       Assessment & Plan:   Problem List Items Addressed This Visit   None    Follow up plan: No follow-ups on file.

## 2023-04-08 ENCOUNTER — Other Ambulatory Visit: Payer: Self-pay

## 2023-04-08 ENCOUNTER — Encounter: Payer: Self-pay | Admitting: Nurse Practitioner

## 2023-04-08 ENCOUNTER — Ambulatory Visit: Payer: 59 | Admitting: Nurse Practitioner

## 2023-04-08 VITALS — BP 134/84 | HR 93 | Temp 97.9°F | Resp 16 | Ht 62.0 in | Wt 270.5 lb

## 2023-04-08 DIAGNOSIS — M79642 Pain in left hand: Secondary | ICD-10-CM | POA: Diagnosis not present

## 2023-04-08 DIAGNOSIS — M779 Enthesopathy, unspecified: Secondary | ICD-10-CM | POA: Diagnosis not present

## 2023-04-08 DIAGNOSIS — M25532 Pain in left wrist: Secondary | ICD-10-CM | POA: Diagnosis not present

## 2023-04-08 MED ORDER — NAPROXEN 500 MG PO TABS
500.0000 mg | ORAL_TABLET | Freq: Two times a day (BID) | ORAL | 0 refills | Status: AC
  Filled 2023-04-08: qty 30, 15d supply, fill #0

## 2023-04-08 MED ORDER — PREDNISONE 10 MG PO TABS
ORAL_TABLET | ORAL | 0 refills | Status: AC
  Filled 2023-04-08: qty 21, 6d supply, fill #0

## 2023-04-25 ENCOUNTER — Other Ambulatory Visit: Payer: Self-pay | Admitting: Nurse Practitioner

## 2023-04-25 ENCOUNTER — Other Ambulatory Visit: Payer: Self-pay

## 2023-04-25 DIAGNOSIS — J302 Other seasonal allergic rhinitis: Secondary | ICD-10-CM

## 2023-04-26 ENCOUNTER — Other Ambulatory Visit: Payer: Self-pay

## 2023-04-26 MED FILL — Montelukast Sodium Tab 10 MG (Base Equiv): ORAL | 90 days supply | Qty: 90 | Fill #0 | Status: AC

## 2023-04-26 NOTE — Telephone Encounter (Signed)
Requested Prescriptions  Pending Prescriptions Disp Refills   montelukast (SINGULAIR) 10 MG tablet 90 tablet 0    Sig: Take 1 tablet (10 mg total) by mouth at bedtime.     Pulmonology:  Leukotriene Inhibitors Passed - 04/25/2023  8:56 PM      Passed - Valid encounter within last 12 months    Recent Outpatient Visits           2 weeks ago Tendinitis   [facility] Berniece Salines, FNP   7 months ago Anemia, iron deficiency   [facility] Berniece Salines, FNP   1 year ago Anemia, iron deficiency   [facility] Berniece Salines, FNP   1 year ago Physical exam, annual   [facility] Berniece Salines, FNP   1 year ago Encounter for medical examination to establish care   [facility] Berniece Salines, FNP

## 2023-04-28 ENCOUNTER — Other Ambulatory Visit: Payer: Self-pay

## 2023-07-23 ENCOUNTER — Encounter: Payer: Self-pay | Admitting: Podiatry

## 2023-07-23 ENCOUNTER — Other Ambulatory Visit: Payer: Self-pay

## 2023-07-23 ENCOUNTER — Ambulatory Visit (INDEPENDENT_AMBULATORY_CARE_PROVIDER_SITE_OTHER): Payer: 59 | Admitting: Podiatry

## 2023-07-23 ENCOUNTER — Ambulatory Visit (INDEPENDENT_AMBULATORY_CARE_PROVIDER_SITE_OTHER): Payer: 59

## 2023-07-23 DIAGNOSIS — M7661 Achilles tendinitis, right leg: Secondary | ICD-10-CM | POA: Diagnosis not present

## 2023-07-23 DIAGNOSIS — M722 Plantar fascial fibromatosis: Secondary | ICD-10-CM | POA: Diagnosis not present

## 2023-07-23 MED ORDER — METHYLPREDNISOLONE 4 MG PO TBPK
ORAL_TABLET | ORAL | 0 refills | Status: AC
  Filled 2023-07-23: qty 21, 6d supply, fill #0

## 2023-07-23 MED ORDER — BETAMETHASONE SOD PHOS & ACET 6 (3-3) MG/ML IJ SUSP
3.0000 mg | Freq: Once | INTRAMUSCULAR | Status: AC
Start: 2023-07-23 — End: 2023-07-23
  Administered 2023-07-23: 3 mg via INTRA_ARTICULAR

## 2023-07-23 NOTE — Progress Notes (Signed)
Patient scanned for orthotics with Footmaxx scanner.

## 2023-07-23 NOTE — Progress Notes (Signed)
   Chief Complaint  Patient presents with   Foot Pain    "I am having pain in both heels." N - heel pain and ankle L - posterior right, plantar - left D - 1 month O - gradually worse C - swelling ankle right, shooting pain,  A - walking, get up in the am T - Naproxen    Subjective: 52 y.o. female presenting today as a new patient for evaluation of pain and tenderness to the bilateral feet.  Ongoing for about a month.  Patient has a history of plantar fasciitis and wears orthotics that are several years old.  She takes Aleve as needed presenting for further treatment evaluation   Past Medical History:  Diagnosis Date   Allergy    Fibroid      Objective: Physical Exam General: The patient is alert and oriented x3 in no acute distress.  Dermatology: Skin is warm, dry and supple bilateral lower extremities. Negative for open lesions or macerations bilateral.   Vascular: Dorsalis Pedis and Posterior Tibial pulses palpable bilateral.  Capillary fill time is immediate to all digits.  Neurological: Grossly intact via light touch  Musculoskeletal: Tenderness to palpation to the plantar aspect of the left heel along the plantar fascia.  There is tenderness with palpation around the Achilles tendon as it inserts on the posterior tubercle of the calcaneus right.  All other joints range of motion within normal limits bilateral. Strength 5/5 in all groups bilateral.   Radiographic exam B/L feet 07/23/2023: Normal osseous mineralization. Joint spaces preserved. No fracture/dislocation/boney destruction. No other soft tissue abnormalities or radiopaque foreign bodies.  Posterior and plantar heel spurs noted bilateral  Assessment: 1. Plantar fasciitis left foot 2.  Insertional Achilles tendinitis right  Plan of Care:  -Patient evaluated.  X-rays reviewed -Injection of 0.5 cc Celestone Soluspan injected in the plantar fascia left -Prescription for Medrol Dosepak -After completion of the  Dosepak continue OTC Aleve -Continue wearing good supportive tennis shoes and sneakers -Today the patient was molded for new custom molded orthotics.  Order placed -Return to clinic for orthotics pickup  *Patient transport at [facility]   Felecia Shelling, DPM [facility]  Dr. Felecia Shelling, DPM    [address] [place], [place] [address]                Office [phone]  Fax [phone]

## 2023-08-06 ENCOUNTER — Ambulatory Visit (INDEPENDENT_AMBULATORY_CARE_PROVIDER_SITE_OTHER): Payer: 59

## 2023-08-06 DIAGNOSIS — M7661 Achilles tendinitis, right leg: Secondary | ICD-10-CM

## 2023-08-06 DIAGNOSIS — M722 Plantar fascial fibromatosis: Secondary | ICD-10-CM

## 2023-08-06 NOTE — Progress Notes (Signed)
Patient presents today to pick up custom molded foot orthotics, diagnosed with Plantar fasciitis by Dr. Nelia Shi.   Orthotics were dispensed and fit was satisfactory. Reviewed instructions for break-in and wear. Written instructions given to patient.  Patient will follow up as needed.   Joanna Miller CPed, CFo, CFm

## 2024-05-15 ENCOUNTER — Ambulatory Visit: Admitting: Nurse Practitioner

## 2024-05-15 ENCOUNTER — Other Ambulatory Visit: Payer: Self-pay

## 2024-05-15 VITALS — BP 154/86 | HR 112 | Temp 98.1°F | Ht 62.0 in | Wt 279.6 lb

## 2024-05-15 DIAGNOSIS — E66813 Obesity, class 3: Secondary | ICD-10-CM | POA: Diagnosis not present

## 2024-05-15 DIAGNOSIS — M62838 Other muscle spasm: Secondary | ICD-10-CM

## 2024-05-15 DIAGNOSIS — R0683 Snoring: Secondary | ICD-10-CM | POA: Diagnosis not present

## 2024-05-15 DIAGNOSIS — Z130 Encounter for screening for diseases of the blood and blood-forming organs and certain disorders involving the immune mechanism: Secondary | ICD-10-CM | POA: Diagnosis not present

## 2024-05-15 DIAGNOSIS — R030 Elevated blood-pressure reading, without diagnosis of hypertension: Secondary | ICD-10-CM | POA: Diagnosis not present

## 2024-05-15 DIAGNOSIS — J302 Other seasonal allergic rhinitis: Secondary | ICD-10-CM | POA: Diagnosis not present

## 2024-05-15 DIAGNOSIS — Z13220 Encounter for screening for lipoid disorders: Secondary | ICD-10-CM

## 2024-05-15 DIAGNOSIS — Z1231 Encounter for screening mammogram for malignant neoplasm of breast: Secondary | ICD-10-CM | POA: Diagnosis not present

## 2024-05-15 DIAGNOSIS — Z6841 Body Mass Index (BMI) 40.0 and over, adult: Secondary | ICD-10-CM

## 2024-05-15 DIAGNOSIS — Z131 Encounter for screening for diabetes mellitus: Secondary | ICD-10-CM | POA: Diagnosis not present

## 2024-05-15 DIAGNOSIS — M79601 Pain in right arm: Secondary | ICD-10-CM

## 2024-05-15 MED ORDER — METHOCARBAMOL 500 MG PO TABS
500.0000 mg | ORAL_TABLET | Freq: Two times a day (BID) | ORAL | 0 refills | Status: AC | PRN
Start: 2024-05-15 — End: ?
  Filled 2024-05-15: qty 20, 10d supply, fill #0

## 2024-05-15 MED ORDER — IBUPROFEN 800 MG PO TABS
800.0000 mg | ORAL_TABLET | Freq: Three times a day (TID) | ORAL | 0 refills | Status: AC | PRN
Start: 2024-05-15 — End: ?
  Filled 2024-05-15: qty 30, 10d supply, fill #0

## 2024-05-15 MED ORDER — MONTELUKAST SODIUM 10 MG PO TABS
10.0000 mg | ORAL_TABLET | Freq: Every day | ORAL | 0 refills | Status: AC
  Filled 2024-05-15: qty 90, 90d supply, fill #0

## 2024-05-15 NOTE — Progress Notes (Signed)
 BP (!) 154/86   Pulse (!) 112   Temp 98.1 F (36.7 C) (Oral)   Ht 5\' 2"  (1.575 m)   Wt 279 lb 9.6 oz (126.8 kg)   SpO2 96%   BMI 51.14 kg/m    Subjective:    Patient ID: Joanna Miller, female    DOB: June 29, 1971, 53 y.o.   MRN: [id number]  HPI: Joanna Miller is a 53 y.o. female  Chief Complaint  Patient presents with   Arm Pain    R arm, onset Friday. Upper arm and radiates to hand, thinks it could be from when she did CPR recertification training- says her back was spasming after doing practice compressions     Discussed the use of AI scribe software for clinical note transcription with the patient, who gave verbal consent to proceed.  History of Present Illness Joanna Miller "Joanna Miller" is a 53 year old female who presents with right arm pain radiating to the hand.  She has been experiencing right arm pain since Friday, following CPR recertification training. The pain originates in the upper arm and radiates down to the hand, associated with performing CPR compressions. She also experienced back spasms during the training.  The pain is described as severe, similar to a 'toothache', and persisted over the weekend without relief. She has difficulty lying on her side due to shoulder pain and reports weakness in her hand, making it hard to grip. She has been taking ibuprofen 800 mg twice a day and alternating between heat and ice applications. Biofreeze has been used for additional relief.  In addition to the arm pain, she experiences neck pain and muscle spasms. She is allergic to meloxicam but tolerates ibuprofen well.  She is attempting to lose weight by reducing fried foods and increasing water intake. She has not tried weight loss medications. She reports snoring and has been told by her son that she snores a lot, although she denies any known sleep apnea.         05/15/2024    2:35 PM 04/08/2023    3:40 PM 09/25/2022    1:33 PM  Depression screen PHQ 2/9  Decreased  Interest 0 0 0  Down, Depressed, Hopeless 0 0 0  PHQ - 2 Score 0 0 0  Altered sleeping 0    Tired, decreased energy 0    Change in appetite 0    Feeling bad or failure about yourself  0    Trouble concentrating 0    Moving slowly or fidgety/restless 0    Suicidal thoughts 0    PHQ-9 Score 0    Difficult doing work/chores Not difficult at all      Relevant past medical, surgical, family and social history reviewed and updated as indicated. Interim medical history since our last visit reviewed. Allergies and medications reviewed and updated.  Review of Systems  Ten systems reviewed and is negative except as mentioned in HPI      Objective:      BP (!) 154/86   Pulse (!) 112   Temp 98.1 F (36.7 C) (Oral)   Ht 5\' 2"  (1.575 m)   Wt 279 lb 9.6 oz (126.8 kg)   SpO2 96%   BMI 51.14 kg/m    Wt Readings from Last 3 Encounters:  05/15/24 279 lb 9.6 oz (126.8 kg)  04/08/23 270 lb 8 oz (122.7 kg)  09/25/22 265 lb 8 oz (120.4 kg)    Physical Exam Vitals reviewed.  Constitutional:  Appearance: Normal appearance.  HENT:     Head: Normocephalic.  Cardiovascular:     Rate and Rhythm: Normal rate and regular rhythm.  Pulmonary:     Effort: Pulmonary effort is normal.     Breath sounds: Normal breath sounds.  Musculoskeletal:     Right shoulder: Tenderness present. Decreased range of motion.  Skin:    General: Skin is warm and dry.  Neurological:     General: No focal deficit present.     Mental Status: She is alert and oriented to person, place, and time. Mental status is at baseline.  Psychiatric:        Mood and Affect: Mood normal.        Behavior: Behavior normal.        Thought Content: Thought content normal.        Judgment: Judgment normal.               Assessment & Plan:   Problem List Items Addressed This Visit       Other   Seasonal allergies   Relevant Medications   montelukast  (SINGULAIR ) 10 MG tablet   Class 3 severe obesity due to  excess calories with serious comorbidity and body mass index (BMI) of 45.0 to 49.9 in adult - Primary   Relevant Orders   CBC with Differential/Platelet   Comprehensive metabolic panel with GFR   Lipid panel   Hemoglobin A1c   TSH   Other Visit Diagnoses       Encounter for screening mammogram for malignant neoplasm of breast       Relevant Orders   MM 3D SCREENING MAMMOGRAM BILATERAL BREAST     Right arm pain       Relevant Medications   methocarbamol (ROBAXIN) 500 MG tablet   ibuprofen (ADVIL) 800 MG tablet     Muscle spasm       Relevant Medications   methocarbamol (ROBAXIN) 500 MG tablet   ibuprofen (ADVIL) 800 MG tablet     Screening for cholesterol level       Relevant Orders   Lipid panel     Screening for diabetes mellitus       Relevant Orders   Comprehensive metabolic panel with GFR   Hemoglobin A1c     Screening for deficiency anemia       Relevant Orders   CBC with Differential/Platelet     Snores       Relevant Orders   Ambulatory referral to Pulmonology     Elevated blood pressure reading            Assessment and Plan Assessment & Plan Right arm pain Acute right arm pain, likely musculoskeletal, possibly due to strain from CPR training. Pain radiates from upper arm to hand, with associated grip weakness. Improved with ibuprofen and icing. - Prescribe 800 mg ibuprofen for pain management. - Advise continued use of heat and ice therapy. - Prescribe a muscle relaxant for additional pain relief. - Recommend topical analgesics like Biofreeze or Icy Hot.  Neck pain Neck pain with muscle spasms, likely related to recent physical activity during CPR training. - Advise continuation of heat and ice therapy. - Consider muscle relaxant for relief of spasms.  Elevated blood pressure Elevated blood pressure, possibly due to pain and fatigue.   Obesity Struggling with weight loss despite dietary changes. Discussed potential pharmacological interventions,  including GLP-1 medications and Contrave. GLP-1 medications may cause abdominal pain, constipation, nausea, vomiting, and fatigue. Contrave may  cause headaches, dry mouth, and fatigue. Phentermine not recommended due to potential side effects and existing hypertension. - Order blood work to assess metabolic status and A1c levels. - Discuss potential weight loss medications, including GLP-1 medications and Contrave, pending insurance coverage and blood work results. - Encourage continuation of lifestyle modifications, including dietary management and regular exercise. -continue to increase physical activity, getting at least 150 min of physical activity a week.  Work on including Runner, broadcasting/film/video 2 days a week.  - continue eating at a calorie deficit 1500-1700 cal a day, eating a well balanced diet with whole foods, avoiding processed foods.     Sleep apnea (suspected) Suspected sleep apnea due to reported snoring and daytime fatigue. No formal diagnosis yet. - Order sleep study to evaluate for sleep apnea. - Refer to pulmonology for further evaluation.        Follow up plan: Return for CPE w/ Pap.

## 2024-05-16 ENCOUNTER — Other Ambulatory Visit: Payer: Self-pay | Admitting: Nurse Practitioner

## 2024-05-16 ENCOUNTER — Other Ambulatory Visit: Payer: Self-pay

## 2024-05-16 ENCOUNTER — Ambulatory Visit: Payer: Self-pay | Admitting: Nurse Practitioner

## 2024-05-16 DIAGNOSIS — E66813 Obesity, class 3: Secondary | ICD-10-CM

## 2024-05-16 LAB — LIPID PANEL
Cholesterol: 175 mg/dL (ref <200)
HDL: 56 mg/dL (ref >=50)
LDL Cholesterol (Calc): 101 mg/dL — ABNORMAL HIGH
Non-HDL Cholesterol (Calc): 119 mg/dL (ref <130)
Total CHOL/HDL Ratio: 3.1 (calc) (ref <5.0)
Triglycerides: 90 mg/dL (ref <150)

## 2024-05-16 LAB — COMPREHENSIVE METABOLIC PANEL WITH GFR
AG Ratio: 1.5 (calc) (ref 1.0–2.5)
ALT: 73 U/L — ABNORMAL HIGH (ref 6–29)
AST: 40 U/L — ABNORMAL HIGH (ref 10–35)
Albumin: 4.3 g/dL (ref 3.6–5.1)
Alkaline phosphatase (APISO): 132 U/L (ref 37–153)
BUN/Creatinine Ratio: 26 (calc) — ABNORMAL HIGH (ref 6–22)
BUN: 12 mg/dL (ref 7–25)
CO2: 32 mmol/L (ref 20–32)
Calcium: 10.9 mg/dL — ABNORMAL HIGH (ref 8.6–10.4)
Chloride: 102 mmol/L (ref 98–110)
Creat: 0.47 mg/dL — ABNORMAL LOW (ref 0.50–1.03)
Globulin: 2.9 g/dL (ref 1.9–3.7)
Glucose, Bld: 161 mg/dL — ABNORMAL HIGH (ref 65–99)
Potassium: 4.4 mmol/L (ref 3.5–5.3)
Sodium: 141 mmol/L (ref 135–146)
Total Bilirubin: 0.4 mg/dL (ref 0.2–1.2)
Total Protein: 7.2 g/dL (ref 6.1–8.1)
eGFR: 113 mL/min/{1.73_m2} (ref >=60)

## 2024-05-16 LAB — CBC WITH DIFFERENTIAL/PLATELET
Absolute Lymphocytes: 1280 {cells}/uL (ref 850–3900)
Absolute Monocytes: 718 {cells}/uL (ref 200–950)
Basophils Absolute: 19 {cells}/uL (ref 0–200)
Basophils Relative: 0.2 %
Eosinophils Absolute: 78 {cells}/uL (ref 15–500)
Eosinophils Relative: 0.8 %
HCT: 43.3 % (ref 35.0–45.0)
Hemoglobin: 14.1 g/dL (ref 11.7–15.5)
MCH: 30.3 pg (ref 27.0–33.0)
MCHC: 32.6 g/dL (ref 32.0–36.0)
MCV: 93.1 fL (ref 80.0–100.0)
MPV: 12.8 fL — ABNORMAL HIGH (ref 7.5–12.5)
Monocytes Relative: 7.4 %
Neutro Abs: 7605 {cells}/uL (ref 1500–7800)
Neutrophils Relative %: 78.4 %
Platelets: 155 10*3/uL (ref 140–400)
RBC: 4.65 10*6/uL (ref 3.80–5.10)
RDW: 12.8 % (ref 11.0–15.0)
Total Lymphocyte: 13.2 %
WBC: 9.7 10*3/uL (ref 3.8–10.8)

## 2024-05-16 LAB — HEMOGLOBIN A1C
Hgb A1c MFr Bld: 6.4 % — ABNORMAL HIGH (ref <5.7)
Mean Plasma Glucose: 137 mg/dL
eAG (mmol/L): 7.6 mmol/L

## 2024-05-16 LAB — TSH: TSH: 1.55 m[IU]/L

## 2024-05-16 MED ORDER — WEGOVY 0.25 MG/0.5ML ~~LOC~~ SOAJ
0.2500 mg | SUBCUTANEOUS | 0 refills | Status: AC
  Filled 2024-05-16: qty 2, 28d supply, fill #0

## 2024-05-22 ENCOUNTER — Ambulatory Visit: Admitting: Sleep Medicine

## 2024-05-22 ENCOUNTER — Encounter: Payer: Self-pay | Admitting: Sleep Medicine

## 2024-05-22 VITALS — BP 120/80 | HR 88 | Temp 97.9°F | Ht 62.0 in | Wt 279.2 lb

## 2024-05-22 DIAGNOSIS — G4733 Obstructive sleep apnea (adult) (pediatric): Secondary | ICD-10-CM

## 2024-05-22 DIAGNOSIS — Z6841 Body Mass Index (BMI) 40.0 and over, adult: Secondary | ICD-10-CM

## 2024-05-22 NOTE — Patient Instructions (Addendum)
 Joanna Miller

## 2024-05-22 NOTE — Progress Notes (Signed)
 Name:Joanna Miller MRN: [id number] DOB: December 23, 1970   CHIEF COMPLAINT:  EXCESSIVE DAYTIME SLEEPINESS   HISTORY OF PRESENT ILLNESS:  Joanna Miller is a 53 y.o. w/ a h/o morbid obesity who presents for c/o loud snoring and excessive daytime sleepiness which has been present for several years. Reports nocturnal awakenings due to nocturia, however does not have difficulty falling back to sleep. 20-30 lb weight gain over the last several months. Admits to night sweats and morning headaches.  Denies RLS symptoms, dream enactment, cataplexy, hypnagogic or hypnapompic hallucinations. Denies a family history of sleep apnea. Denies drowsy driving. Drinks coffee occasionally, denies alcohol, tobacco or illicit drug use.   Bedtime 10 pm Sleep onset 1 min Rise time 4:45 am   EPWORTH SLEEP SCORE 6    05/22/2024    9:00 AM  Results of the Epworth flowsheet  Sitting and reading 1  Watching TV 1  Sitting, inactive in a public place (e.g. a theatre or a meeting) 2  As a passenger in a car for an hour without a break 0  Lying down to rest in the afternoon when circumstances permit 1  Sitting and talking to someone 0  Sitting quietly after a lunch without alcohol 1  In a car, while stopped for a few minutes in traffic 0  Total score 6     PAST MEDICAL HISTORY :   has a past medical history of Allergy and Fibroid.  has a past surgical history that includes No past surgeries and Tubal ligation. Prior to Admission medications   Medication Sig Start Date End Date Taking? Authorizing Provider  azelastine  (ASTELIN ) 0.1 % nasal spray Place 1 spray into both nostrils 2 (two) times daily. Use in each nostril as directed Patient not taking: Reported on 05/15/2024 01/03/19   Briscoe Cancer, PA-C  fluticasone  (FLONASE ) 50 MCG/ACT nasal spray Place 2 sprays into both nostrils daily. 09/21/16   Fisher, Rufino Coulter, PA-C  ibuprofen  (ADVIL ) 800 MG tablet Take 1 tablet (800 mg total) by mouth every 8 (eight)  hours as needed for moderate pain (pain score 4-6). 05/15/24   Quinton Buckler, FNP  Iron , Ferrous Sulfate , 325 (65 Fe) MG TABS Take 325 mg by mouth daily. 09/26/21   Pender, Julie F, FNP  loratadine  (CLARITIN ) 10 MG tablet Take 1 tablet (10 mg total) by mouth daily. Patient not taking: Reported on 05/15/2024 03/26/22   Pender, Julie F, FNP  methocarbamol  (ROBAXIN ) 500 MG tablet Take 1 tablet (500 mg total) by mouth 2 (two) times daily as needed for muscle spasms. 05/15/24   Pender, Julie F, FNP  montelukast  (SINGULAIR ) 10 MG tablet Take 1 tablet (10 mg total) by mouth at bedtime. 05/15/24   Pender, Julie F, FNP  naproxen  (NAPROSYN ) 500 MG tablet Take 1 tablet (500 mg total) by mouth 2 (two) times daily with a meal. 04/08/23   Quinton Buckler, FNP  Relugolix -Estradiol -Norethind (MYFEMBREE ) 40-1-0.5 MG TABS Take 1 tablet by mouth daily. 04/01/22   Zenobia Hila, MD  WEGOVY  0.25 MG/0.5ML SOAJ Inject 0.25 mg into the skin once a week. Use this dose for 1 month (4 shots) and then increase to next higher dose. 05/16/24   Quinton Buckler, FNP   Allergies  Allergen Reactions   Meloxicam Other (See Comments)    Spikes my blood pressure.    FAMILY HISTORY:  family history includes Heart attack in her father; Hypertension in her daughter, daughter, father, mother, and son. SOCIAL  HISTORY:  reports that she has never smoked. She has never used smokeless tobacco. She reports that she does not currently use alcohol. She reports that she does not use drugs.   Review of Systems:  Gen:  Denies  fever, sweats, chills weight loss  HEENT: Denies blurred vision, double vision, ear pain, eye pain, hearing loss, nose bleeds, sore throat Cardiac:  No dizziness, chest pain or heaviness, chest tightness,edema, No JVD Resp:   No cough, -sputum production, -shortness of breath,-wheezing, -hemoptysis,  Gi: Denies swallowing difficulty, stomach pain, nausea or vomiting, diarrhea, constipation, bowel incontinence Gu:  Denies  bladder incontinence, burning urine Ext:   Denies Joint pain, stiffness or swelling Skin: Denies  skin rash, easy bruising or bleeding or hives Endoc:  Denies polyuria, polydipsia , polyphagia or weight change Psych:   Denies depression, insomnia or hallucinations  Other:  All other systems negative  VITAL SIGNS: BP 120/80 (BP Location: Left Arm, Patient Position: Sitting, Cuff Size: Large)   Pulse 88   Temp 97.9 F (36.6 C) (Oral)   Ht 5\' 2"  (1.575 m)   Wt 279 lb 3.2 oz (126.6 kg)   SpO2 96%   BMI 51.07 kg/m     Physical Examination:   General Appearance: No distress  EYES PERRLA, EOM intact.   NECK Supple, No JVD Pulmonary: normal breath sounds, No wheezing.  CardiovascularNormal S1,S2.  No m/r/g.   Abdomen: Benign, Soft, non-tender. Skin:   warm, no rashes, no ecchymosis  Extremities: normal, no cyanosis, clubbing. Neuro:without focal findings,  speech normal  PSYCHIATRIC: Mood, affect within normal limits.   ASSESSMENT AND PLAN  OSA I suspect that OSA is likely present due to clinical presentation. Discussed the consequences of untreated sleep apnea. Advised not to drive drowsy for safety of patient and others. Will complete further evaluation with a home sleep study and follow up to review results.    Morbid obesity Counseled patient on diet and lifestyle modification.    MEDICATION ADJUSTMENTS/LABS AND TESTS ORDERED: Recommend Sleep Study   Patient  satisfied with Plan of action and management. All questions answered  Follow up to review HST results and treatment plan.   I spent a total of 47 minutes reviewing chart data, face-to-face evaluation with the patient, counseling and coordination of care as detailed above.    Varsha Knock, M.D.  Sleep Medicine [facility]

## 2024-05-25 ENCOUNTER — Encounter: Payer: Self-pay | Admitting: Nurse Practitioner

## 2024-05-25 ENCOUNTER — Telehealth: Payer: Self-pay | Admitting: Pharmacy Technician

## 2024-05-25 ENCOUNTER — Other Ambulatory Visit (HOSPITAL_COMMUNITY): Payer: Self-pay

## 2024-05-25 NOTE — Telephone Encounter (Signed)
 Pharmacy Patient Advocate Encounter   Received notification from Patient Advice Request messages that prior authorization for WEGOVY  0.25 MG is required/requested.   Insurance verification completed.   The patient is insured through [facility] .   Per test claim: PLAN/BENEFIT EXCLUSION  [place] plan do not pay for Wegovy  under no circumstances and this decision was made last year.

## 2024-05-25 NOTE — Telephone Encounter (Signed)
 PA request has been Received. New Encounter has been or will be created for follow up. For additional info see Pharmacy Prior Auth telephone encounter from 05/25/24.

## 2024-05-29 ENCOUNTER — Other Ambulatory Visit: Payer: Self-pay | Admitting: Nurse Practitioner

## 2024-05-29 DIAGNOSIS — E66813 Obesity, class 3: Secondary | ICD-10-CM

## 2024-05-29 MED ORDER — NALTREXONE-BUPROPION HCL ER 8-90 MG PO TB12: ORAL_TABLET | ORAL | 3 refills | Status: AC

## 2024-05-30 ENCOUNTER — Other Ambulatory Visit: Payer: Self-pay
